# Patient Record
Sex: Male | Born: 1975
Health system: Southern US, Community
[De-identification: ages and names within clinical notes are randomized; demographics above are authoritative.]

## PROBLEM LIST (undated history)

## (undated) DIAGNOSIS — F32A Depression, unspecified: Secondary | ICD-10-CM

## (undated) DIAGNOSIS — F1910 Other psychoactive substance abuse, uncomplicated: Secondary | ICD-10-CM

## (undated) DIAGNOSIS — F329 Major depressive disorder, single episode, unspecified: Secondary | ICD-10-CM

## (undated) DIAGNOSIS — F419 Anxiety disorder, unspecified: Secondary | ICD-10-CM

## (undated) DIAGNOSIS — M549 Dorsalgia, unspecified: Secondary | ICD-10-CM

## (undated) DIAGNOSIS — G8929 Other chronic pain: Secondary | ICD-10-CM

## (undated) DIAGNOSIS — Z720 Tobacco use: Secondary | ICD-10-CM

## (undated) DIAGNOSIS — F259 Schizoaffective disorder, unspecified: Secondary | ICD-10-CM

---

## 1999-04-11 ENCOUNTER — Ambulatory Visit (HOSPITAL_COMMUNITY): Admission: RE | Admit: 1999-04-11 | Discharge: 1999-04-11 | Payer: Self-pay | Admitting: Neurosurgery

## 1999-04-11 ENCOUNTER — Encounter: Payer: Self-pay | Admitting: Neurosurgery

## 1999-04-19 ENCOUNTER — Ambulatory Visit (HOSPITAL_COMMUNITY): Admission: RE | Admit: 1999-04-19 | Discharge: 1999-04-19 | Payer: Self-pay | Admitting: Neurosurgery

## 1999-04-19 ENCOUNTER — Encounter: Payer: Self-pay | Admitting: Neurosurgery

## 1999-05-03 ENCOUNTER — Encounter: Payer: Self-pay | Admitting: Neurosurgery

## 1999-05-03 ENCOUNTER — Ambulatory Visit (HOSPITAL_COMMUNITY): Admission: RE | Admit: 1999-05-03 | Discharge: 1999-05-03 | Payer: Self-pay | Admitting: Neurosurgery

## 1999-05-17 ENCOUNTER — Ambulatory Visit (HOSPITAL_COMMUNITY): Admission: RE | Admit: 1999-05-17 | Discharge: 1999-05-17 | Payer: Self-pay | Admitting: Neurosurgery

## 1999-05-17 ENCOUNTER — Encounter: Payer: Self-pay | Admitting: Neurosurgery

## 2005-05-27 ENCOUNTER — Emergency Department (HOSPITAL_COMMUNITY): Admission: EM | Admit: 2005-05-27 | Discharge: 2005-05-27 | Payer: Self-pay | Admitting: Emergency Medicine

## 2005-05-30 ENCOUNTER — Encounter: Admission: RE | Admit: 2005-05-30 | Discharge: 2005-05-30 | Payer: Self-pay | Admitting: Neurosurgery

## 2005-06-13 ENCOUNTER — Encounter: Admission: RE | Admit: 2005-06-13 | Discharge: 2005-06-13 | Payer: Self-pay | Admitting: Neurosurgery

## 2008-07-26 ENCOUNTER — Emergency Department (HOSPITAL_COMMUNITY): Admission: EM | Admit: 2008-07-26 | Discharge: 2008-07-26 | Payer: Self-pay | Admitting: Emergency Medicine

## 2008-07-27 ENCOUNTER — Inpatient Hospital Stay (HOSPITAL_COMMUNITY): Admission: EM | Admit: 2008-07-27 | Discharge: 2008-08-04 | Payer: Self-pay | Admitting: Emergency Medicine

## 2008-08-21 ENCOUNTER — Emergency Department (HOSPITAL_COMMUNITY): Admission: EM | Admit: 2008-08-21 | Discharge: 2008-08-21 | Payer: Self-pay | Admitting: Emergency Medicine

## 2008-11-14 ENCOUNTER — Ambulatory Visit (HOSPITAL_COMMUNITY): Admission: RE | Admit: 2008-11-14 | Discharge: 2008-11-14 | Payer: Self-pay | Admitting: Chiropractic Medicine

## 2010-06-13 ENCOUNTER — Emergency Department (HOSPITAL_COMMUNITY): Admission: EM | Admit: 2010-06-13 | Discharge: 2010-06-13 | Payer: Self-pay | Admitting: Family Medicine

## 2010-11-04 ENCOUNTER — Emergency Department (HOSPITAL_COMMUNITY)
Admission: EM | Admit: 2010-11-04 | Discharge: 2010-11-04 | Disposition: A | Discharge status: Home / Self Care | Payer: Medicaid Other | Source: Ambulatory Visit | Attending: Emergency Medicine | Admitting: Emergency Medicine

## 2010-11-04 DIAGNOSIS — M542 Cervicalgia: Secondary | ICD-10-CM | POA: Insufficient documentation

## 2010-11-04 DIAGNOSIS — F3289 Other specified depressive episodes: Secondary | ICD-10-CM | POA: Insufficient documentation

## 2010-11-04 DIAGNOSIS — M549 Dorsalgia, unspecified: Secondary | ICD-10-CM | POA: Insufficient documentation

## 2010-11-04 DIAGNOSIS — R070 Pain in throat: Secondary | ICD-10-CM | POA: Insufficient documentation

## 2010-11-04 DIAGNOSIS — J36 Peritonsillar abscess: Secondary | ICD-10-CM | POA: Insufficient documentation

## 2010-11-04 DIAGNOSIS — R51 Headache: Secondary | ICD-10-CM | POA: Insufficient documentation

## 2010-11-04 DIAGNOSIS — F329 Major depressive disorder, single episode, unspecified: Secondary | ICD-10-CM | POA: Insufficient documentation

## 2010-11-04 DIAGNOSIS — Z79899 Other long term (current) drug therapy: Secondary | ICD-10-CM | POA: Insufficient documentation

## 2010-11-04 DIAGNOSIS — IMO0002 Reserved for concepts with insufficient information to code with codable children: Secondary | ICD-10-CM | POA: Insufficient documentation

## 2010-11-04 DIAGNOSIS — G8929 Other chronic pain: Secondary | ICD-10-CM | POA: Insufficient documentation

## 2010-11-04 DIAGNOSIS — F988 Other specified behavioral and emotional disorders with onset usually occurring in childhood and adolescence: Secondary | ICD-10-CM | POA: Insufficient documentation

## 2010-11-04 DIAGNOSIS — R599 Enlarged lymph nodes, unspecified: Secondary | ICD-10-CM | POA: Insufficient documentation

## 2011-01-29 NOTE — Consult Note (Signed)
Richard Benitez, HELFMAN NO.:  [id number]   MEDICAL RECORD NO.:  [id number]          PATIENT TYPE:  INP   LOCATION:  1236                         FACILITY:  [facility]   PHYSICIAN:  Antonietta Breach, M.D.  DATE OF BIRTH:  1976-04-29   DATE OF CONSULTATION:  07/27/2008  DATE OF DISCHARGE:                                 CONSULTATION   REASON FOR CONSULTATION:  Psychosis.   REQUESTING PHYSICIAN:  Larina Earthly, M.D.   HISTORY OF PRESENT ILLNESS:  Mr. Pryor Guettler is a 35 year old male  admitted to the [facility] on July 27, 2008, with acute  mental status changes.   Mr. Chandler has had a thorough organic workup.  He has been seen twice in  the emergency room for hallucinations, delusions in the context of a new  onset.  His urine drug screen is positive for marijuana.  The patient is  a poor historian.  However, when interviewing his sister she states that  she does not know any history of any form of psychomimetic substances.   The patient continues with difficulty with orientation.  He has ideas of  reference and hallucinations.   PAST PSYCHIATRIC HISTORY:  None known.  The patient does not have a  history of this kind of episode before.   FAMILY PSYCHIATRIC HISTORY:  There is a reported history of depression.   SOCIAL HISTORY:  The patient has been living with his parents.  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  He did start  resmoking marijuana recently.  He drinks two beers a week.  He did have  some experimentation with some other illegal drugs in his teens.   PAST MEDICAL HISTORY:  Urinary tract infection, chronic low back pain.   ALLERGIES:  No known drug allergies.   MEDICATIONS:  1. MAR is reviewed.  Mr. Hauss is on Ativan and has received 2 mg      three times over the past 24 hours.  2. Thiamine 100 mg daily.  3. Haldol 2.5 mg q.4 h p.r.n.  4. Ativan 1 mg q. 1 hour p.r.n.   DATA:  His organic workup is  negative including a basic metabolic panel  and his SGOT is 77, SGPT 66.  Urine drug screen benzodiazepine and THC  positive.  Ammonia, ethanol and TSH all unremarkable.  WBC 7.5,  hemoglobin 14.8, platelet count 94.  Head CT without contrast  unremarkable.   REVIEW OF SYSTEMS:  Unremarkable.   PHYSICAL EXAMINATION:  VITAL SIGNS:  Vital signs stable.  MENTAL STATUS EXAM:  Mr. Gamel is agitated, alternating between sitting  on his hospital bed and pacing.  His attention span is poor.  His  thought process is disorganized.  Eye contact is poor.  Concentration is  poor.  Orientation - the patient is not oriented to place, the month or  the year.  He appears to be oriented to himself.  Memory 3/3 visual  objects immediately, 2/3 visual objects on recall.  He does have ideas  of reference on thought content and  additional thought process data.  There are looseness of associations.  He has racing thoughts.  His  judgment is impaired.  His insight is poor.  He is not combative.  He is  redirectable.   ASSESSMENT:  AXIS I:  Rule out 293.82 psychotic disorder not otherwise  specified versus delirium not otherwise specified.  AXIS II:  Deferred.  AXIS III:  See past medical history.  AXIS IV:  General medical primary support group.  AXIS V:  20.   RECOMMENDATIONS:  1. Would check a B12, folic acid and an RPR.  2. Would start Zyprexa 5 mg p.o. or IM q. 1600 for anti-psychosis and      anti-agitation.  3. Would increase the Ativan p.r.n. to 1-3 mg q.4 h p.o. IM or IV      p.r.n. severe anxiety and agitation.  4. Would admit to an inpatient psychiatric ward as soon as possible.  5. If the patient's memory and cognitive status do not improve would      execute a further organic workup including an MRI with and without      contrast and consult neurology.      Antonietta Breach, M.D.  Electronically Signed     JW/MEDQ  D:  07/29/2008  T:  07/29/2008  Job:  [id number]

## 2011-01-29 NOTE — Consult Note (Signed)
NAMEBENJIMIN, HADDEN NO.:  [id number]   MEDICAL RECORD NO.:  [id number]          PATIENT TYPE:  INP   LOCATION:  1421                         FACILITY:  [facility]   PHYSICIAN:  Antonietta Breach, M.D.  DATE OF BIRTH:  July 07, 1976   DATE OF CONSULTATION:  08/02/2008  DATE OF DISCHARGE:                                 CONSULTATION   FOLLOWUP:  Mr. Poehler has recovered from his severe confusion.  He  received his last Ativan yesterday.  He is oriented to all spheres.  His  memory function is now intact.  His mood and interests and hope are all  intact.  He is not having anymore hallucinations or delusions.   MENTAL STATUS EXAM:  Eye contact is good.  Affect is slightly anxious at  baseline but with a broad and appropriate range.  Mood is within normal  limits.  He is oriented to all spheres.  Memory function is now intact  for immediate, recent and remote.  Speech is within normal limits.  Thought process logical, coherent, goal-directed.  No looseness of  associations.  Thought content:  No thoughts of harming himself.  No  thoughts of harming others.  No delusions or hallucinations.  Insight is  intact.  Judgment is intact.   ASSESSMENT:  1. 293.82 psychotic disorder, not otherwise specified.  He is now      cleared for discharge.  2. Polysubstance dependence.  3. Mr. Hattabaugh agrees to call emergency services immediately for any      thoughts of harming himself, thoughts of harming others or      distress.   RECOMMENDATIONS:  Emphatically with a chemical dependency, inpatient  rehabilitation program.  However, the patient still wants to think about  it. The 12-step method and groups were also recommended.  He wants to  meet with the social worker in assisting him and gaining additional  information about his rehabilitation options.      Antonietta Breach, M.D.  Electronically Signed     JW/MEDQ  D:  08/04/2008  T:  08/04/2008  Job:  [id number]

## 2011-01-29 NOTE — H&P (Signed)
NAMEMANCE, VALLEJO NO.:  [id number]   MEDICAL RECORD NO.:  [id number]          PATIENT TYPE:  INP   LOCATION:  1510                         [facility]ACILITY:  [facility]   PHYSICIAN:  Eduard Clos, MDDATE OF BIRTH:  1976-05-11   DATE OF ADMISSION:  07/27/2008  DATE OF DISCHARGE:                              HISTORY & PHYSICAL   History obtained from patient, patient's family and ER physician.   PRIMARY CARE PHYSICIAN:  Dr. Larina Earthly.   CHIEF COMPLAINT:  Altered mental status.   HISTORY OF PRESENT ILLNESS:  A 35 year old male with history of  depression, off medication for almost 6 months now, and chronic low back  pain who was brought into the ER by the patient's family as the patient  was getting increasingly confused with hallucinations, both visual and  auditory.  The patient had these symptoms over the last 3 days and  states that he has not actually eaten well at all.  He had symptoms for  the last 3 days.  He called his mother over the phone yesterday.  While  in the ER, he was given some IV Ativan and sent home with p.o. Ativan  for follow up with behavioral health, despite which the patient was  getting more and more confused and brought in again to the ER.  The  patient has been presently admitted because of his continued agitation  and sinus tachycardia.  The patient states that he has not been doing  well the last 3 days, and he states he smokes cigarettes.  He denies any  drug abuse, though his urine drug was positive for marijuana.  He drinks  alcohol only twice a week, usually beer.  He denies any chest pain,  shortness of breath, loss of consciousness, weakness of limbs or  abdominal pain, though he states he does have a knot on his flank area  which is bothering him at times with acute pain.  He denies any  diarrhea, dysuria, discharges, fever, chills or headache.   PAST MEDICAL HISTORY:  History of depression and chronic back pain, off  medications now.  He was initially treated for his chronic back pain  with Flexeril and hydrocodone, which was stopped in July by his primary  care physician.   PAST SURGICAL HISTORY:  None.   MEDICATIONS PRIOR TO ADMISSION:  None, except Ativan which was given  yesterday   ALLERGIES:  NO KNOWN DRUG ALLERGIES.   SOCIAL HISTORY:  The patient lives with his parents.  Smokes cigarettes  and advised to quit smoking.  Drinks alcohol twice a week, usually  Budwe[employer] beer.  He denies any drug abuse.  He states he only did drugs  when he was in his teens.  Drug screen is positive for marijuana and  also benzo which he just took yesterday.   REVIEW OF SYSTEMS:  As per history of present illness, nothing else  significant.   PHYSICAL EXAMINATION:  GENERAL:  Patient examined at bedside.  He is  currently restless.  He is able to answer questions.  He has some  hallucinations.  VITAL SIGNS:  Blood pressure is 140/80, pulse 120 per minute, regular  sinus, temperature 99.1, respiration 18 per minute.  O2 sat 98%.  HEENT:  Anicteric.  No pallor.  CHEST:  Bilateral air entry present.  No rhonchi, no crepitation.  HEART:  S1-S2 heard.  ABDOMEN:  Soft, nontender.  Bowel sounds heard.  No guarding, no  rigidity.  CNS:  Alert, awake, oriented to time, place and person.  He is  complaining of hallucinations, very restless.  Moves upper and lower  extremities 5/5.  EXTREMITIES:  Peripheral pulses felt.  No edema.   LABORATORY DATA:  CT of the head shows nothing acute.  CBC - WBCs 10,  hemoglobin 16.3, hematocrit 48.3, MCV 100, platelets 121, neutrophils  86%.  Comprehensive metabolic panel:  Sodium 140, potassium 3, chloride  97, carbon dioxide 29, glucose 181, BUN 26, creatinine 1.2, total  bilirubin 2.3, AST 97, ALT 84, total protein 8.4, albumin 5.2, calcium  10.6.  Drug screen is positive for benzo and marijuana, alcohol level  less than 5.  UA is positive for ketones, moderate bilirubin,  nitrates  positive, leukocytes small, WBC 3-6, bacteria few   ASSESSMENT:  1. Altered mental status with visual and auditory hallucinations and      restlessness, probably withdrawing from drugs versus acute      psychosis.  2. Abnormal liver function tests.  3. Polysubstance abuse.  4. Sinus tachycardia, probably from agitation.  5. Chronic history of low back pain.  6. Urinary tract infection.   PLAN:  Admit the patient to step-down unit for closer observation.  Will  place the patient on Ativan 1 liter every hour for agitation as needed.  Also place on Haldol 2.5 mEq every 4 hours p.r.n. for agitation.  Will  send blood cultures, urine cultures, place patient on IV antibiotics.  EKG.  Will check a D-dimer and Tylenol level.  Repeat ammonia and LFTs  in a.m.  Get a CT of the abdomen and pelvis as there was a worrisome  nodule in his left flank area, which he states disturbs him.  Further  recommendation as condition evolves.  The patient will need a  psychiatric assessment.      Eduard Clos, MD  Electronically Signed     ANK/MEDQ  D:  07/27/2008  T:  07/27/2008  Job:  [id number]

## 2011-01-29 NOTE — Discharge Summary (Signed)
Richard Benitez, Richard Benitez                 ACCOUNT NO.:  [id number]   MEDICAL RECORD NO.:  [id number]          PATIENT TYPE:  INP   LOCATION:  1421                         FACILITY:  [facility]   PHYSICIAN:  Larina Earthly, M.D.        DATE OF BIRTH:  10/26/1975   DATE OF ADMISSION:  07/27/2008  DATE OF DISCHARGE:  08/04/2008                               DISCHARGE SUMMARY   DISCHARGE DIAGNOSES:  1. Ethanol withdrawal with some related delirium tremens with severe      agitation.  2. Severe fatty liver, presumably secondary to alcohol intake.  3. Chronic low back pain, presumably secondary to herniated disk of      longstanding duration.  4. Mood disorder.  5. Questionable rhabdomyolysis.   DISCHARGE MEDICATIONS:  1. Hydrocodone 10 mg every 4 hours as needed.  Prescription for 50      with no refills.  2. Duragesic patch 75 mcg/hr, change every 3 days.  Prescription for      10 with no refills.  3. Thiamine 100 mg every day.  4. Folic acid 1 mg every day.  5. Ativan 1 mg every 8 hours.  Prescription for 90 with no refills.  6. Nicotine patch 21 mg per 24 hours, prescription for 28, no refills.   Patient was instructed to stop all illegal substances and all alcohol of  any kind.  Patient is to follow up with my office in 3 weeks for further  evaluation and management as well as liver function tests.  Patient also  has an appointment with [facility] at [address] [place], [place] [address] at 12 noon on the day of  discharge, at which time they will decide whether the patient is  appropriate for inpatient versus outpatient management, hopefully  inpatient management.  To me, it is unclear as to whether they will  continue the patient's oral medications or whether I will be  responsible.  Certainly, the clinical social worker is to make a  disposition on this on the day of discharge.  Again, patient was  instructed to discontinue all illegal substances as well as  any alcohol  intake on the day of discharge and was given the reasons for so on  multiple occasions.   PERTINENT LABS/RADIOLOGICAL FINDINGS:  On the day of discharge, sodium  143, potassium 4.2, BUN 12, creatinine 0.79, glucose 107, serum CO2 33.  Please note that the patient did suffer from some hypokalemia requiring  supplementation with a low potassium of 2.7 during the hospitalization.  CBC was unremarkable.  AST was 59, down from a high of 153.  ALT was  111, relatively stable throughout this hospitalization.  Alkaline  phosphatase was 86.  Total bilirubin 0.5, down from a high of 1.3.  Albumin 3.6.  CK's were initially elevated at 1587, decreased to 134  prior to discharge.  Calcium 9.7, phosphorus 4.2, magnesium 1.7, folate  472.  Blood cultures x1 negative.  Ammonia normal at 10.  Hepatitis  panel negative.   Abdominal and pelvis CT with contrast reveals severe  fatty infiltration  of the liver with no evidence of intraperitoneal air or ascites and  negative for lymphadenopathy.  Adrenal gland, kidneys, gallbladder,  pancreas, and spleen were within normal limits.   Pelvic CT was suggestive of bilateral sacroiliitis but no other findings  in the pelvis.   HISTORY OF PRESENT ILLNESS:  Patient was admitted onto the hospitalists'  service on July 27, 2008.  Please see the history and physical for  extensive details; however, the patient was admitted with altered mental  status with visual and auditory hallucinations, restlessness, thought to  be withdrawing from drugs versus acute psychosis in the setting of  abnormal liver function tests, polysubstance abuse, sinus tachycardia,  chronic low back pain, and urinary tract infection.  Patient was  admitted to step-down unit, placed on Ativan and Haldol and subsequently  transferred to my service within the next 24 hours.   HOSPITAL COURSE:  Patient's agitation progressively got worse, requiring  an IV Ativan drip as high as 14  mg/hr.  Critical care medicine did  consult on the patient briefly and did recommend the Ativan be changed  over to Valium, given potential toxic agents at this high dose from the  vehicle of the benzodiazepine drip.  At no time did the patient suffer  from respiratory depression.  He was also continued on Ativan.  Psychiatric consultation was obtained with Dr. Antonietta Breach, who also  recommended inpatient admission for psychiatric issues, including his  acute psychosis once medically stable.  Of note, B12, folate, RPR were  all unremarkable.  The acute hepatitis panel was unremarkable.  Laboratory evaluations, despite the patient's severe fatty liver,  altered mental status, patient did not develop any evidence of acute  liver failure, inability to synthesize needed proteins, or coagulopathy.  That was critically relevant.  Patient was given significant IV fluids  with D5, thiamine, and folate.  He was restrained for safety.  His CK  levels were monitored, given initial elevation, and he never suffered  from renal insufficiency.  Lack of nutrition was discussed with the  family; however, given the fact that the patient improved clinically  over the next 48 hours, tube feedings were never started.  Potassium was  repleted.  Extensive discussions were undertaken with the patient as  well as family with respect to his presumed alcohol withdrawal/delirium  tremens.  Family reports that 15 empty bottles of liquor were found in  the patient's room.  This is complicated by the patient's history of  longstanding benzodiazepine dependence as well as narcotic dependence in  the setting of his chronic low back pain.  After the patient's mental  status normalized, at which point, the patient was taking 1 mg of Ativan  every 8 hours along with morphine and oral narcotics for his chronic low  back pain.  It was decided that he would be an appropriate candidate for  substance abuse treatment.  The  patient was agreeable.  Clinical Social  Work set up an appointment in conjunction with Dr. Jeanie Sewer.  An  appointment with [facility] in [place]  for August 04, 2008, at which time he will hopefully  be assessed for  further inpatient rehab efforts.   Of note, given the sacroiliitis found on initial CT scan ordered by the  hospitalists, we will further evaluate the patient for any type of  connective tissue disease and/or rheumatological disorders on an  outpatient basis when the patient follows up in our office in  3 weeks.      Larina Earthly, M.D.  Electronically Signed     RA/MEDQ  D:  08/04/2008  T:  08/04/2008  Job:  [id number]

## 2011-06-18 LAB — COMPREHENSIVE METABOLIC PANEL
ALT: 108 — ABNORMAL HIGH
ALT: 111 — ABNORMAL HIGH
ALT: 76 — ABNORMAL HIGH
ALT: 82 — ABNORMAL HIGH
ALT: 91 — ABNORMAL HIGH
ALT: 92 — ABNORMAL HIGH
AST: 59 — ABNORMAL HIGH
AST: 68 — ABNORMAL HIGH
AST: 78 — ABNORMAL HIGH
AST: 95 — ABNORMAL HIGH
Albumin: 3.2 — ABNORMAL LOW
Albumin: 3.3 — ABNORMAL LOW
Albumin: 3.3 — ABNORMAL LOW
Alkaline Phosphatase: 73
Alkaline Phosphatase: 76
Alkaline Phosphatase: 79
BUN: 12
BUN: 14
BUN: 24 — ABNORMAL HIGH
BUN: 4 — ABNORMAL LOW
BUN: 7
BUN: 7
CO2: 25
CO2: 26
CO2: 33 — ABNORMAL HIGH
CO2: 34 — ABNORMAL HIGH
Calcium: 10.4
Calcium: 8.6
Calcium: 9.2
Calcium: 9.5
Calcium: 9.5
Calcium: 9.7
Chloride: 103
Chloride: 107
Chloride: 110
Chloride: 97
Creatinine, Ser: 0.74
Creatinine, Ser: 0.76
Creatinine, Ser: 0.84
Creatinine, Ser: 0.87
GFR calc Af Amer: >60
GFR calc Af Amer: >60
GFR calc Af Amer: >60
GFR calc non Af Amer: >60
GFR calc non Af Amer: >60
GFR calc non Af Amer: >60
GFR calc non Af Amer: >60
GFR calc non Af Amer: >60
GFR calc non Af Amer: >60
GFR calc non Af Amer: >60
Glucose, Bld: 100 — ABNORMAL HIGH
Glucose, Bld: 121 — ABNORMAL HIGH
Glucose, Bld: 75
Glucose, Bld: 78
Glucose, Bld: 91
Glucose, Bld: 97
Potassium: 2.7 — CL
Potassium: 3 — ABNORMAL LOW
Potassium: 3.4 — ABNORMAL LOW
Potassium: 4.2
Sodium: 140
Sodium: 140
Sodium: 141
Sodium: 141
Sodium: 143
Sodium: 144
Total Bilirubin: 0.4
Total Bilirubin: 0.8
Total Bilirubin: 1.2
Total Bilirubin: 1.3 — ABNORMAL HIGH
Total Bilirubin: 1.5 — ABNORMAL HIGH
Total Bilirubin: 2.3 — ABNORMAL HIGH
Total Protein: 5.6 — ABNORMAL LOW
Total Protein: 5.9 — ABNORMAL LOW
Total Protein: 6
Total Protein: 6.3
Total Protein: 6.4
Total Protein: 7.4

## 2011-06-18 LAB — CBC
HCT: 33.1 — ABNORMAL LOW
HCT: 34 — ABNORMAL LOW
HCT: 36.1 — ABNORMAL LOW
HCT: 37 — ABNORMAL LOW
HCT: 39.6
HCT: 43.2
HCT: 48.3
Hemoglobin: 11.6 — ABNORMAL LOW
Hemoglobin: 13.6
Hemoglobin: 14.8
Hemoglobin: 16.3
MCHC: 33.7
MCHC: 34.1
MCHC: 34.3
MCHC: 34.3
MCV: 100.2 — ABNORMAL HIGH
MCV: 100.7 — ABNORMAL HIGH
MCV: 100.9 — ABNORMAL HIGH
MCV: 99.1
MCV: 99.7
Platelets: 108 — ABNORMAL LOW
Platelets: 121 — ABNORMAL LOW
Platelets: 238
Platelets: 75 — ABNORMAL LOW
Platelets: 86 — ABNORMAL LOW
RBC: 3.58 — ABNORMAL LOW
RBC: 3.93 — ABNORMAL LOW
RBC: 4.83
RDW: 12.7
RDW: 12.9
WBC: 10
WBC: 4.4
WBC: 4.5
WBC: 5.5
WBC: 7.3
WBC: 7.5

## 2011-06-18 LAB — AMMONIA
Ammonia: 10 — ABNORMAL LOW
Ammonia: 21

## 2011-06-18 LAB — DIFFERENTIAL
Basophils Absolute: 0
Basophils Relative: 0
Eosinophils Absolute: 0
Lymphocytes Relative: 22
Lymphs Abs: 1.7
Monocytes Absolute: 0.8
Monocytes Relative: 11
Monocytes Relative: 6
Neutrophils Relative %: 67
Neutrophils Relative %: 86 — ABNORMAL HIGH

## 2011-06-18 LAB — MAGNESIUM: Magnesium: 1.9

## 2011-06-18 LAB — BASIC METABOLIC PANEL
BUN: 5 — ABNORMAL LOW
CO2: 26
Chloride: 111
Creatinine, Ser: 0.7
Glucose, Bld: 92
Potassium: 3.5

## 2011-06-18 LAB — HEPATITIS PANEL, ACUTE: HCV Ab: NEGATIVE

## 2011-06-18 LAB — URINE MICROSCOPIC-ADD ON

## 2011-06-18 LAB — URINALYSIS, ROUTINE W REFLEX MICROSCOPIC
Glucose, UA: NEGATIVE
Hgb urine dipstick: NEGATIVE
Nitrite: POSITIVE — AB
pH: 6.5

## 2011-06-18 LAB — APTT: aPTT: 27

## 2011-06-18 LAB — HEPATIC FUNCTION PANEL
Alkaline Phosphatase: 87
Bilirubin, Direct: 0.3
Indirect Bilirubin: 1 — ABNORMAL HIGH
Total Bilirubin: 1.3 — ABNORMAL HIGH

## 2011-06-18 LAB — CK
Total CK: 134
Total CK: 242 — ABNORMAL HIGH

## 2011-06-18 LAB — FOLATE RBC: RBC Folate: 472

## 2011-06-18 LAB — RAPID URINE DRUG SCREEN, HOSP PERFORMED
Amphetamines: NOT DETECTED
Benzodiazepines: POSITIVE — AB
Cocaine: NOT DETECTED
Opiates: NOT DETECTED
Tetrahydrocannabinol: POSITIVE — AB

## 2011-06-18 LAB — PHOSPHORUS: Phosphorus: 4.2

## 2011-06-18 LAB — PROTIME-INR: INR: 0.9

## 2011-06-18 LAB — CULTURE, BLOOD (ROUTINE X 2)

## 2011-06-18 LAB — ETHANOL: Alcohol, Ethyl (B): <5

## 2011-06-18 LAB — CK TOTAL AND CKMB (NOT AT ARMC): Total CK: 169

## 2011-06-21 LAB — DIFFERENTIAL
Basophils Absolute: 0 10*3/uL (ref 0.0–0.1)
Eosinophils Absolute: 0 10*3/uL (ref 0.0–0.7)
Lymphs Abs: 1.3 10*3/uL (ref 0.7–4.0)
Neutrophils Relative %: 66 % (ref 43–77)

## 2011-06-21 LAB — POCT I-STAT, CHEM 8
Creatinine, Ser: 1.3 mg/dL (ref 0.4–1.5)
HCT: 44 % (ref 39.0–52.0)
Hemoglobin: 15 g/dL (ref 13.0–17.0)
Potassium: 3.6 mEq/L (ref 3.5–5.1)
Sodium: 144 mEq/L (ref 135–145)

## 2011-06-21 LAB — RAPID URINE DRUG SCREEN, HOSP PERFORMED
Amphetamines: NOT DETECTED
Barbiturates: NOT DETECTED
Benzodiazepines: POSITIVE — AB

## 2011-06-21 LAB — CBC
MCV: 98.3 fL (ref 78.0–100.0)
Platelets: 273 10*3/uL (ref 150–400)
WBC: 5.5 10*3/uL (ref 4.0–10.5)

## 2011-06-21 LAB — ETHANOL: Alcohol, Ethyl (B): <5 mg/dL (ref 0–10)

## 2011-09-24 DIAGNOSIS — K219 Gastro-esophageal reflux disease without esophagitis: Secondary | ICD-10-CM | POA: Diagnosis not present

## 2011-09-24 DIAGNOSIS — F329 Major depressive disorder, single episode, unspecified: Secondary | ICD-10-CM | POA: Diagnosis not present

## 2011-09-24 DIAGNOSIS — J309 Allergic rhinitis, unspecified: Secondary | ICD-10-CM | POA: Diagnosis not present

## 2011-09-24 DIAGNOSIS — M549 Dorsalgia, unspecified: Secondary | ICD-10-CM | POA: Diagnosis not present

## 2011-11-01 DIAGNOSIS — F329 Major depressive disorder, single episode, unspecified: Secondary | ICD-10-CM | POA: Diagnosis not present

## 2012-01-30 DIAGNOSIS — F329 Major depressive disorder, single episode, unspecified: Secondary | ICD-10-CM | POA: Diagnosis not present

## 2012-01-30 DIAGNOSIS — M549 Dorsalgia, unspecified: Secondary | ICD-10-CM | POA: Diagnosis not present

## 2012-04-24 DIAGNOSIS — M549 Dorsalgia, unspecified: Secondary | ICD-10-CM | POA: Diagnosis not present

## 2012-04-24 DIAGNOSIS — F329 Major depressive disorder, single episode, unspecified: Secondary | ICD-10-CM | POA: Diagnosis not present

## 2012-04-24 DIAGNOSIS — J309 Allergic rhinitis, unspecified: Secondary | ICD-10-CM | POA: Diagnosis not present

## 2012-06-04 DIAGNOSIS — F3189 Other bipolar disorder: Secondary | ICD-10-CM | POA: Diagnosis not present

## 2012-06-18 DIAGNOSIS — F3189 Other bipolar disorder: Secondary | ICD-10-CM | POA: Diagnosis not present

## 2012-06-30 DIAGNOSIS — F3189 Other bipolar disorder: Secondary | ICD-10-CM | POA: Diagnosis not present

## 2012-07-07 DIAGNOSIS — F3189 Other bipolar disorder: Secondary | ICD-10-CM | POA: Diagnosis not present

## 2012-07-16 DIAGNOSIS — F3189 Other bipolar disorder: Secondary | ICD-10-CM | POA: Diagnosis not present

## 2012-08-10 DIAGNOSIS — F3189 Other bipolar disorder: Secondary | ICD-10-CM | POA: Diagnosis not present

## 2012-08-25 DIAGNOSIS — M549 Dorsalgia, unspecified: Secondary | ICD-10-CM | POA: Diagnosis not present

## 2012-08-25 DIAGNOSIS — F329 Major depressive disorder, single episode, unspecified: Secondary | ICD-10-CM | POA: Diagnosis not present

## 2012-11-09 DIAGNOSIS — F3189 Other bipolar disorder: Secondary | ICD-10-CM | POA: Diagnosis not present

## 2012-11-23 DIAGNOSIS — Z79899 Other long term (current) drug therapy: Secondary | ICD-10-CM | POA: Diagnosis not present

## 2012-11-23 DIAGNOSIS — M549 Dorsalgia, unspecified: Secondary | ICD-10-CM | POA: Diagnosis not present

## 2013-01-07 DIAGNOSIS — F3189 Other bipolar disorder: Secondary | ICD-10-CM | POA: Diagnosis not present

## 2013-03-23 DIAGNOSIS — F329 Major depressive disorder, single episode, unspecified: Secondary | ICD-10-CM | POA: Diagnosis not present

## 2013-03-23 DIAGNOSIS — Z79899 Other long term (current) drug therapy: Secondary | ICD-10-CM | POA: Diagnosis not present

## 2013-03-23 DIAGNOSIS — M545 Low back pain: Secondary | ICD-10-CM | POA: Diagnosis not present

## 2013-03-23 DIAGNOSIS — K219 Gastro-esophageal reflux disease without esophagitis: Secondary | ICD-10-CM | POA: Diagnosis not present

## 2013-04-07 DIAGNOSIS — F3189 Other bipolar disorder: Secondary | ICD-10-CM | POA: Diagnosis not present

## 2013-06-07 DIAGNOSIS — F3189 Other bipolar disorder: Secondary | ICD-10-CM | POA: Diagnosis not present

## 2013-07-07 DIAGNOSIS — F3189 Other bipolar disorder: Secondary | ICD-10-CM | POA: Diagnosis not present

## 2013-08-03 DIAGNOSIS — Z6827 Body mass index (BMI) 27.0-27.9, adult: Secondary | ICD-10-CM | POA: Diagnosis not present

## 2013-08-03 DIAGNOSIS — Z1331 Encounter for screening for depression: Secondary | ICD-10-CM | POA: Diagnosis not present

## 2013-08-03 DIAGNOSIS — M549 Dorsalgia, unspecified: Secondary | ICD-10-CM | POA: Diagnosis not present

## 2013-08-03 DIAGNOSIS — K219 Gastro-esophageal reflux disease without esophagitis: Secondary | ICD-10-CM | POA: Diagnosis not present

## 2013-08-03 DIAGNOSIS — F329 Major depressive disorder, single episode, unspecified: Secondary | ICD-10-CM | POA: Diagnosis not present

## 2013-08-03 DIAGNOSIS — J309 Allergic rhinitis, unspecified: Secondary | ICD-10-CM | POA: Diagnosis not present

## 2013-09-11 ENCOUNTER — Encounter (HOSPITAL_COMMUNITY): Payer: Self-pay | Admitting: Emergency Medicine

## 2013-09-11 ENCOUNTER — Emergency Department (HOSPITAL_COMMUNITY)
Admission: EM | Admit: 2013-09-11 | Discharge: 2013-09-12 | Disposition: A | Discharge status: Home / Self Care | Payer: Medicare Other | Attending: Emergency Medicine | Admitting: Emergency Medicine

## 2013-09-11 ENCOUNTER — Emergency Department (HOSPITAL_COMMUNITY): Payer: Medicare Other

## 2013-09-11 DIAGNOSIS — F17200 Nicotine dependence, unspecified, uncomplicated: Secondary | ICD-10-CM | POA: Insufficient documentation

## 2013-09-11 DIAGNOSIS — Z79899 Other long term (current) drug therapy: Secondary | ICD-10-CM | POA: Diagnosis not present

## 2013-09-11 DIAGNOSIS — R609 Edema, unspecified: Secondary | ICD-10-CM | POA: Insufficient documentation

## 2013-09-11 DIAGNOSIS — K029 Dental caries, unspecified: Secondary | ICD-10-CM | POA: Insufficient documentation

## 2013-09-11 DIAGNOSIS — K047 Periapical abscess without sinus: Secondary | ICD-10-CM

## 2013-09-11 DIAGNOSIS — G8929 Other chronic pain: Secondary | ICD-10-CM | POA: Diagnosis not present

## 2013-09-11 DIAGNOSIS — M549 Dorsalgia, unspecified: Secondary | ICD-10-CM | POA: Diagnosis not present

## 2013-09-11 DIAGNOSIS — H00039 Abscess of eyelid unspecified eye, unspecified eyelid: Secondary | ICD-10-CM | POA: Insufficient documentation

## 2013-09-11 DIAGNOSIS — K089 Disorder of teeth and supporting structures, unspecified: Secondary | ICD-10-CM | POA: Diagnosis present

## 2013-09-11 DIAGNOSIS — F411 Generalized anxiety disorder: Secondary | ICD-10-CM | POA: Insufficient documentation

## 2013-09-11 DIAGNOSIS — L03213 Periorbital cellulitis: Secondary | ICD-10-CM

## 2013-09-11 HISTORY — DX: Dorsalgia, unspecified: M54.9

## 2013-09-11 HISTORY — DX: Other chronic pain: G89.29

## 2013-09-11 HISTORY — DX: Anxiety disorder, unspecified: F41.9

## 2013-09-11 LAB — CBC WITH DIFFERENTIAL/PLATELET
Hemoglobin: 12.5 g/dL — ABNORMAL LOW (ref 13.0–17.0)
Lymphocytes Relative: 29 % (ref 12–46)
Lymphs Abs: 2 10*3/uL (ref 0.7–4.0)
MCH: 29.6 pg (ref 26.0–34.0)
Monocytes Relative: 7 % (ref 3–12)
Neutro Abs: 4.3 10*3/uL (ref 1.7–7.7)
Neutrophils Relative %: 62 % (ref 43–77)
RBC: 4.22 MIL/uL (ref 4.22–5.81)
WBC: 6.9 10*3/uL (ref 4.0–10.5)

## 2013-09-11 LAB — POCT I-STAT, CHEM 8
Chloride: 102 mEq/L (ref 96–112)
Creatinine, Ser: 1 mg/dL (ref 0.50–1.35)
Glucose, Bld: 93 mg/dL (ref 70–99)
Potassium: 4 mEq/L (ref 3.5–5.1)
Sodium: 144 mEq/L (ref 135–145)

## 2013-09-11 MED ORDER — SODIUM CHLORIDE 0.9 % IV BOLUS (SEPSIS)
1000.0000 mL | Freq: Once | INTRAVENOUS | Status: AC
  Administered 2013-09-11: 1000 mL via INTRAVENOUS

## 2013-09-11 MED ORDER — CLINDAMYCIN PHOSPHATE 600 MG/50ML IV SOLN: 600.0000 mg | Freq: Once | INTRAVENOUS | Status: DC

## 2013-09-11 MED ORDER — IOHEXOL 300 MG/ML  SOLN
100.0000 mL | Freq: Once | INTRAMUSCULAR | Status: AC | PRN
  Administered 2013-09-11: 100 mL via INTRAVENOUS

## 2013-09-11 MED ORDER — CLINDAMYCIN PHOSPHATE 900 MG/50ML IV SOLN
900.0000 mg | Freq: Once | INTRAVENOUS | Status: AC
  Administered 2013-09-11: 900 mg via INTRAVENOUS
  Filled 2013-09-11 (×2): qty 50

## 2013-09-11 NOTE — ED Provider Notes (Signed)
CSN: [id number]     Arrival date & time 09/11/13  1827 History  This chart was scribed for non-physician practitioner, Ivonne Andrew, PA-C,working with Junius Argyle, MD, by Karle Plumber, ED Scribe.  This patient was seen in room WTR5/WTR5 and the patient's care was started at 9:35 PM.  Chief Complaint  Patient presents with  . Oral Swelling   The history is provided by the patient. No language interpreter was used.   HPI Comments:  Richard Benitez is a 36 y.o. male who presents to the Emergency Department complaining of facial and left eye swelling. Pt states he contacted his PCP and was called in Augmentin, which he states he has started. Pt reports associated itching of the left eye and face. His PCP and dentist recommended he come to the ED yesterday. Pt states the symptoms have worsened since yesterday. He denies fever. No other aggravating or alleviating factors. No other associated symptoms.   Past Medical History  Diagnosis Date  . Back pain, chronic   . Anxiety    History reviewed. No pertinent past surgical history. No family history on file. History  Substance Use Topics  . Smoking status: Current Every Day Smoker  . Smokeless tobacco: Not on file  . Alcohol Use: No    Review of Systems  Constitutional: Negative for fever and chills.  HENT: Positive for dental problem.   All other systems reviewed and are negative.    Allergies  Review of patient's allergies indicates no known allergies.  Home Medications   Current Outpatient Rx  Name  Route  Sig  Dispense  Refill  . amoxicillin-clavulanate (AUGMENTIN) 875-125 MG per tablet   Oral   Take 1 tablet by mouth 2 (two) times daily.         . clonazePAM (KLONOPIN) 1 MG tablet   Oral   Take 1 mg by mouth 3 (three) times daily.         . cyclobenzaprine (FLEXERIL) 10 MG tablet   Oral   Take 10 mg by mouth 3 (three) times daily as needed for muscle spasms.         Marland Kitchen HYDROcodone-acetaminophen  (NORCO) 10-325 MG per tablet   Oral   Take 1 tablet by mouth every 6 (six) hours as needed for moderate pain or severe pain.         Marland Kitchen morphine (MS CONTIN) 60 MG 12 hr tablet   Oral   Take 60 mg by mouth every 12 (twelve) hours.         Marland Kitchen PRESCRIPTION MEDICATION   Oral   Take 20 mg by mouth daily.         . risperiDONE (RISPERDAL) 2 MG tablet   Oral   Take 1 mg by mouth 2 (two) times daily.         . traZODone (DESYREL) 100 MG tablet   Oral   Take 100 mg by mouth 2 (two) times daily.          Triage Vitals: BP 112/73  Pulse 107  Temp(Src) 98.5 F (36.9 C) (Oral)  Resp 20  SpO2 99% Physical Exam  Nursing note and vitals reviewed. Constitutional: He is oriented to person, place, and time. He appears well-developed and well-nourished.  HENT:  Head: Normocephalic and atraumatic.  Mouth/Throat: Dental caries present.  Left upper K9 and left premolar decayed. Left facial swelling. Left periorbital swelling/infraorbital swelling.   Eyes: EOM are normal.  Neck: Normal range of motion.  Cardiovascular:  Normal rate.   Pulmonary/Chest: Effort normal.  Musculoskeletal: Normal range of motion.  Neurological: He is alert and oriented to person, place, and time.  Skin: Skin is warm and dry.  Psychiatric: He has a normal mood and affect. His behavior is normal.    ED Course  Procedures  DIAGNOSTIC STUDIES: Oxygen Saturation is 99% on RA, normal by my interpretation.   COORDINATION OF CARE: 9:41 PM- Will obtain lab work and give IV antibiotics. Will obtain imaging of the head. Pt verbalizes understanding and agrees to plan.  Patient continues to be doing well. CT scan does not show significant abscess infection. There is a slight periapical infection around the left canine tooth. Patient also with very mild inferior periorbital edema and cellulitis. Patient has received IV dose of clindamycin. At this time we'll plan to continue clindamycin at home. Patient given strict  return precautions and advised for a recheck in the next 24-48 hours. He agrees with this plan.   Medications  sodium chloride 0.9 % bolus 1,000 mL (not administered)  clindamycin (CLEOCIN) IVPB 900 mg (not administered)   Results for orders placed during the hospital encounter of 09/11/13  CBC WITH DIFFERENTIAL      Result Value Range   WBC 6.9  4.0 - 10.5 K/uL   RBC 4.22  4.22 - 5.81 MIL/uL   Hemoglobin 12.5 (*) 13.0 - 17.0 g/dL   HCT 16.1 (*) 09.6 - 04.5 %   MCV 89.6  78.0 - 100.0 fL   MCH 29.6  26.0 - 34.0 pg   MCHC 33.1  30.0 - 36.0 g/dL   RDW 40.9  81.1 - 91.4 %   Platelets 260  150 - 400 K/uL   Neutrophils Relative % 62  43 - 77 %   Neutro Abs 4.3  1.7 - 7.7 K/uL   Lymphocytes Relative 29  12 - 46 %   Lymphs Abs 2.0  0.7 - 4.0 K/uL   Monocytes Relative 7  3 - 12 %   Monocytes Absolute 0.5  0.1 - 1.0 K/uL   Eosinophils Relative 2  0 - 5 %   Eosinophils Absolute 0.1  0.0 - 0.7 K/uL   Basophils Relative 1  0 - 1 %   Basophils Absolute 0.0  0.0 - 0.1 K/uL  POCT I-STAT, CHEM 8      Result Value Range   Sodium 144  135 - 145 mEq/L   Potassium 4.0  3.5 - 5.1 mEq/L   Chloride 102  96 - 112 mEq/L   BUN 15  6 - 23 mg/dL   Creatinine, Ser 7.82  0.50 - 1.35 mg/dL   Glucose, Bld 93  70 - 99 mg/dL   Calcium, Ion 9.56 (*) 1.12 - 1.23 mmol/L   TCO2 31  0 - 100 mmol/L   Hemoglobin 13.6  13.0 - 17.0 g/dL   HCT 21.3  08.6 - 57.8 %     Imaging Review Ct Maxillofacial W/cm  09/11/2013   CLINICAL DATA:  Facial and left eye swelling, on antibiotics.  EXAM: CT MAXILLOFACIAL WITH CONTRAST  TECHNIQUE: Multidetector CT imaging of the maxillofacial structures was performed with intravenous contrast. Multiplanar CT image reconstructions were also generated. A small metallic BB was placed on the right temple in order to reliably differentiate right from left.  CONTRAST:  OMNIPAQUE IOHEXOL 300 MG/ML  SOLN  COMPARISON:  CT of the head July 26, 2008.  FINDINGS: Minimal left  periorbital/facial soft tissue swelling without subcutaneous gas,  radiopaque foreign bodies, free fluid or abscess. Ocular globes are well formed and located. Preservation orbital fat. Optic nerve sheath complexes are unremarkable. Extraocular muscles are well formed and located, symmetric in appearance. Superior ophthalmic veins are not enlarged.  Trace paranasal sinus mucosal thickening, no air-fluid levels. Visualized mastoid air cells are well aerated. No destructive bony lesions. Left mandible incisor periapical lucency (approximate tooth number 24). Additional caries noted. Included airway is widely patent.  IMPRESSION: Minimal left periorbital/facial soft tissue swelling could reflect cellulitis without abscess or postseptal involvement.  Poor dentition with periapical abscess.   Electronically Signed   By: Awilda Metro   On: 09/11/2013 23:31      MDM   1. Periapical abscess   2. Periorbital cellulitis of left eye      I personally performed the services described in this documentation, which was scribed in my presence. The recorded information has been reviewed and is accurate.    Angus Seller, PA-C 09/13/13 [phone]

## 2013-09-11 NOTE — ED Notes (Signed)
Pt presents with some facial and oral swelling x 2 days.

## 2013-09-11 NOTE — ED Notes (Signed)
Patient transported to CT 

## 2013-09-11 NOTE — ED Notes (Addendum)
Top left tooth decayed, swelling of jaw, eye puffy, pt states the swelling took place fairly quick. Spoke with his Dr who told him to come to the ED

## 2013-09-11 NOTE — ED Notes (Signed)
Assumed care of patient Patient with swelling r/t dental carries Patient able to speak in full, complete sentences without difficulty--able to handle secretions Patient in NAD

## 2013-09-12 MED ORDER — CLINDAMYCIN HCL 300 MG PO CAPS: 300.0000 mg | ORAL_CAPSULE | Freq: Four times a day (QID) | ORAL | Status: DC

## 2013-09-13 NOTE — ED Provider Notes (Signed)
Medical screening examination/treatment/procedure(s) were performed by non-physician practitioner and as supervising physician I was immediately available for consultation/collaboration.  EKG Interpretation   None         Junius Argyle, MD 09/13/13 1026

## 2013-09-14 DIAGNOSIS — R6884 Jaw pain: Secondary | ICD-10-CM | POA: Diagnosis not present

## 2013-09-14 DIAGNOSIS — K047 Periapical abscess without sinus: Secondary | ICD-10-CM | POA: Diagnosis not present

## 2013-09-14 DIAGNOSIS — Z6827 Body mass index (BMI) 27.0-27.9, adult: Secondary | ICD-10-CM | POA: Diagnosis not present

## 2013-09-30 DIAGNOSIS — F3189 Other bipolar disorder: Secondary | ICD-10-CM | POA: Diagnosis not present

## 2013-11-07 ENCOUNTER — Emergency Department (HOSPITAL_COMMUNITY): Payer: Medicare Other

## 2013-11-07 ENCOUNTER — Observation Stay (HOSPITAL_COMMUNITY)
Admission: EM | Admit: 2013-11-07 | Discharge: 2013-11-08 | Disposition: A | Discharge status: Home / Self Care | Payer: Medicare Other | Attending: Internal Medicine | Admitting: Internal Medicine

## 2013-11-07 ENCOUNTER — Encounter (HOSPITAL_COMMUNITY): Payer: Self-pay | Admitting: Emergency Medicine

## 2013-11-07 DIAGNOSIS — F32A Depression, unspecified: Secondary | ICD-10-CM | POA: Diagnosis present

## 2013-11-07 DIAGNOSIS — E876 Hypokalemia: Secondary | ICD-10-CM | POA: Diagnosis present

## 2013-11-07 DIAGNOSIS — R339 Retention of urine, unspecified: Secondary | ICD-10-CM

## 2013-11-07 DIAGNOSIS — Z609 Problem related to social environment, unspecified: Secondary | ICD-10-CM | POA: Insufficient documentation

## 2013-11-07 DIAGNOSIS — F329 Major depressive disorder, single episode, unspecified: Secondary | ICD-10-CM | POA: Diagnosis present

## 2013-11-07 DIAGNOSIS — D649 Anemia, unspecified: Secondary | ICD-10-CM | POA: Diagnosis present

## 2013-11-07 DIAGNOSIS — R4182 Altered mental status, unspecified: Secondary | ICD-10-CM | POA: Diagnosis not present

## 2013-11-07 DIAGNOSIS — M549 Dorsalgia, unspecified: Secondary | ICD-10-CM | POA: Insufficient documentation

## 2013-11-07 DIAGNOSIS — F17200 Nicotine dependence, unspecified, uncomplicated: Secondary | ICD-10-CM | POA: Insufficient documentation

## 2013-11-07 DIAGNOSIS — E860 Dehydration: Secondary | ICD-10-CM

## 2013-11-07 DIAGNOSIS — G9340 Encephalopathy, unspecified: Principal | ICD-10-CM

## 2013-11-07 DIAGNOSIS — J984 Other disorders of lung: Secondary | ICD-10-CM | POA: Diagnosis not present

## 2013-11-07 DIAGNOSIS — Z720 Tobacco use: Secondary | ICD-10-CM | POA: Diagnosis present

## 2013-11-07 DIAGNOSIS — F411 Generalized anxiety disorder: Secondary | ICD-10-CM | POA: Insufficient documentation

## 2013-11-07 DIAGNOSIS — F333 Major depressive disorder, recurrent, severe with psychotic symptoms: Secondary | ICD-10-CM | POA: Insufficient documentation

## 2013-11-07 DIAGNOSIS — N179 Acute kidney failure, unspecified: Secondary | ICD-10-CM | POA: Diagnosis not present

## 2013-11-07 DIAGNOSIS — T50901A Poisoning by unspecified drugs, medicaments and biological substances, accidental (unintentional), initial encounter: Secondary | ICD-10-CM

## 2013-11-07 DIAGNOSIS — G8929 Other chronic pain: Secondary | ICD-10-CM

## 2013-11-07 DIAGNOSIS — T424X4A Poisoning by benzodiazepines, undetermined, initial encounter: Secondary | ICD-10-CM | POA: Diagnosis not present

## 2013-11-07 DIAGNOSIS — F1910 Other psychoactive substance abuse, uncomplicated: Secondary | ICD-10-CM | POA: Insufficient documentation

## 2013-11-07 DIAGNOSIS — F419 Anxiety disorder, unspecified: Secondary | ICD-10-CM | POA: Diagnosis present

## 2013-11-07 HISTORY — DX: Other psychoactive substance abuse, uncomplicated: F19.10

## 2013-11-07 HISTORY — DX: Depression, unspecified: F32.A

## 2013-11-07 HISTORY — DX: Major depressive disorder, single episode, unspecified: F32.9

## 2013-11-07 HISTORY — DX: Tobacco use: Z72.0

## 2013-11-07 LAB — CBC WITH DIFFERENTIAL/PLATELET
BASOS ABS: 0 10*3/uL (ref 0.0–0.1)
Basophils Relative: 0 % (ref 0–1)
EOS PCT: 0 % (ref 0–5)
Eosinophils Absolute: 0 10*3/uL (ref 0.0–0.7)
HCT: 35.7 % — ABNORMAL LOW (ref 39.0–52.0)
Hemoglobin: 11.8 g/dL — ABNORMAL LOW (ref 13.0–17.0)
LYMPHS PCT: 14 % (ref 12–46)
Lymphs Abs: 1.6 10*3/uL (ref 0.7–4.0)
MCH: 29.8 pg (ref 26.0–34.0)
MCHC: 33.1 g/dL (ref 30.0–36.0)
MCV: 90.2 fL (ref 78.0–100.0)
Monocytes Absolute: 1 10*3/uL (ref 0.1–1.0)
Monocytes Relative: 9 % (ref 3–12)
NEUTROS PCT: 77 % (ref 43–77)
Neutro Abs: 9.1 10*3/uL — ABNORMAL HIGH (ref 1.7–7.7)
PLATELETS: 236 10*3/uL (ref 150–400)
RBC: 3.96 MIL/uL — ABNORMAL LOW (ref 4.22–5.81)
RDW: 13.3 % (ref 11.5–15.5)
WBC: 11.8 10*3/uL — AB (ref 4.0–10.5)

## 2013-11-07 LAB — URINALYSIS, ROUTINE W REFLEX MICROSCOPIC
BILIRUBIN URINE: NEGATIVE
Glucose, UA: NEGATIVE mg/dL
Hgb urine dipstick: NEGATIVE
KETONES UR: NEGATIVE mg/dL
LEUKOCYTES UA: NEGATIVE
Nitrite: NEGATIVE
PROTEIN: NEGATIVE mg/dL
Specific Gravity, Urine: <1.005 — ABNORMAL LOW (ref 1.005–1.030)
UROBILINOGEN UA: 0.2 mg/dL (ref 0.0–1.0)
pH: 6 (ref 5.0–8.0)

## 2013-11-07 LAB — RAPID URINE DRUG SCREEN, HOSP PERFORMED
AMPHETAMINES: NOT DETECTED
Barbiturates: NOT DETECTED
Benzodiazepines: NOT DETECTED
Cocaine: NOT DETECTED
OPIATES: POSITIVE — AB
Tetrahydrocannabinol: POSITIVE — AB

## 2013-11-07 LAB — BASIC METABOLIC PANEL
BUN: 19 mg/dL (ref 6–23)
CALCIUM: 9.4 mg/dL (ref 8.4–10.5)
CO2: 26 meq/L (ref 19–32)
Chloride: 100 mEq/L (ref 96–112)
Creatinine, Ser: 1.31 mg/dL (ref 0.50–1.35)
GFR calc Af Amer: 80 mL/min — ABNORMAL LOW (ref >90)
GFR, EST NON AFRICAN AMERICAN: 69 mL/min — AB (ref >90)
Glucose, Bld: 115 mg/dL — ABNORMAL HIGH (ref 70–99)
Potassium: 3.4 mEq/L — ABNORMAL LOW (ref 3.7–5.3)
SODIUM: 139 meq/L (ref 137–147)

## 2013-11-07 LAB — SALICYLATE LEVEL

## 2013-11-07 LAB — HEPATIC FUNCTION PANEL
ALT: 29 U/L (ref 0–53)
AST: 15 U/L (ref 0–37)
Albumin: 4.3 g/dL (ref 3.5–5.2)
Alkaline Phosphatase: 137 U/L — ABNORMAL HIGH (ref 39–117)
BILIRUBIN TOTAL: 0.2 mg/dL — AB (ref 0.3–1.2)
Total Protein: 7.8 g/dL (ref 6.0–8.3)

## 2013-11-07 LAB — ACETAMINOPHEN LEVEL

## 2013-11-07 LAB — ETHANOL

## 2013-11-07 MED ORDER — NICOTINE 14 MG/24HR TD PT24
14.0000 mg | MEDICATED_PATCH | Freq: Every day | TRANSDERMAL | Status: DC
  Administered 2013-11-07 – 2013-11-08 (×2): 14 mg via TRANSDERMAL
  Filled 2013-11-07 (×2): qty 1

## 2013-11-07 MED ORDER — SODIUM CHLORIDE 0.9 % IV BOLUS (SEPSIS)
1000.0000 mL | Freq: Once | INTRAVENOUS | Status: AC
  Administered 2013-11-07: 1000 mL via INTRAVENOUS

## 2013-11-07 MED ORDER — SODIUM CHLORIDE 0.9 % IJ SOLN: 3.0000 mL | Freq: Two times a day (BID) | INTRAMUSCULAR | Status: DC

## 2013-11-07 MED ORDER — MORPHINE SULFATE ER 30 MG PO TBCR
30.0000 mg | EXTENDED_RELEASE_TABLET | Freq: Two times a day (BID) | ORAL | Status: DC
  Administered 2013-11-07 – 2013-11-08 (×2): 30 mg via ORAL
  Filled 2013-11-07 (×2): qty 1

## 2013-11-07 MED ORDER — ALBUTEROL SULFATE (2.5 MG/3ML) 0.083% IN NEBU: 2.5000 mg | INHALATION_SOLUTION | RESPIRATORY_TRACT | Status: DC | PRN

## 2013-11-07 MED ORDER — CLONAZEPAM 0.5 MG PO TABS
1.0000 mg | ORAL_TABLET | Freq: Three times a day (TID) | ORAL | Status: DC
  Administered 2013-11-07 – 2013-11-08 (×4): 1 mg via ORAL
  Filled 2013-11-07 (×4): qty 2

## 2013-11-07 MED ORDER — DOCUSATE SODIUM 100 MG PO CAPS
100.0000 mg | ORAL_CAPSULE | Freq: Two times a day (BID) | ORAL | Status: DC
  Administered 2013-11-07 – 2013-11-08 (×2): 100 mg via ORAL
  Filled 2013-11-07 (×6): qty 1

## 2013-11-07 MED ORDER — POTASSIUM CHLORIDE IN NACL 20-0.9 MEQ/L-% IV SOLN
INTRAVENOUS | Status: AC
  Administered 2013-11-07 – 2013-11-08 (×2): via INTRAVENOUS

## 2013-11-07 MED ORDER — MORPHINE SULFATE 2 MG/ML IJ SOLN: 2.0000 mg | INTRAMUSCULAR | Status: DC | PRN

## 2013-11-07 MED ORDER — ONDANSETRON HCL 4 MG/2ML IJ SOLN: 4.0000 mg | Freq: Four times a day (QID) | INTRAMUSCULAR | Status: DC | PRN

## 2013-11-07 MED ORDER — HYDROCODONE-ACETAMINOPHEN 5-325 MG PO TABS
1.0000 | ORAL_TABLET | ORAL | Status: DC | PRN
  Administered 2013-11-08: 2 via ORAL
  Filled 2013-11-07: qty 2

## 2013-11-07 MED ORDER — POTASSIUM CHLORIDE CRYS ER 20 MEQ PO TBCR
30.0000 meq | EXTENDED_RELEASE_TABLET | Freq: Once | ORAL | Status: AC
  Administered 2013-11-07: 30 meq via ORAL
  Filled 2013-11-07 (×2): qty 1

## 2013-11-07 MED ORDER — ACETAMINOPHEN 650 MG RE SUPP: 650.0000 mg | Freq: Four times a day (QID) | RECTAL | Status: DC | PRN

## 2013-11-07 MED ORDER — ONDANSETRON HCL 4 MG PO TABS: 4.0000 mg | ORAL_TABLET | Freq: Four times a day (QID) | ORAL | Status: DC | PRN

## 2013-11-07 MED ORDER — ACETAMINOPHEN 325 MG PO TABS: 650.0000 mg | ORAL_TABLET | Freq: Four times a day (QID) | ORAL | Status: DC | PRN
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
## 2013-11-07 NOTE — ED Notes (Signed)
EMS reports was called out for confusion.  Family reports thinks that pt is abusing his medications.  Reports pt sleeps all day and stays up all night.  EMS reported pt's sister said she also has some xanax missing.  Pt had 120 vicodin filled on 2/20 and  91 are left.  Pt says has taken 3 vicodin and some flexeril this morning for chronic back pain.  Pt denies SI or HI.   Pt states "no, I like everyone in here."  Pt answers some questions appropriately but then looses track of what he is saying.  Asked pt what medications he took today and he said, "Vicodin, flexeril, lettuce, and the movie [place]."    Pt restless, disoriented to day of the week.  Mother at bedside.
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
## 2013-11-07 NOTE — ED Notes (Signed)
Mother spoke with her daughter and she has Robaxin 500 mg missing approximately 10-15 tabs gone.

## 2013-11-07 NOTE — ED Notes (Signed)
Neuro assessment normal.

## 2013-11-08 ENCOUNTER — Encounter (HOSPITAL_COMMUNITY): Payer: Self-pay | Admitting: Family

## 2013-11-08 DIAGNOSIS — E860 Dehydration: Secondary | ICD-10-CM

## 2013-11-08 DIAGNOSIS — R4182 Altered mental status, unspecified: Secondary | ICD-10-CM

## 2013-11-08 DIAGNOSIS — G9340 Encephalopathy, unspecified: Secondary | ICD-10-CM | POA: Diagnosis not present

## 2013-11-08 DIAGNOSIS — T50901A Poisoning by unspecified drugs, medicaments and biological substances, accidental (unintentional), initial encounter: Secondary | ICD-10-CM

## 2013-11-08 DIAGNOSIS — M549 Dorsalgia, unspecified: Secondary | ICD-10-CM | POA: Diagnosis not present

## 2013-11-08 DIAGNOSIS — N179 Acute kidney failure, unspecified: Secondary | ICD-10-CM

## 2013-11-08 DIAGNOSIS — G8929 Other chronic pain: Secondary | ICD-10-CM

## 2013-11-08 LAB — BASIC METABOLIC PANEL
BUN: 9 mg/dL (ref 6–23)
CHLORIDE: 111 meq/L (ref 96–112)
CO2: 27 mEq/L (ref 19–32)
CREATININE: 0.96 mg/dL (ref 0.50–1.35)
Calcium: 8.8 mg/dL (ref 8.4–10.5)
GFR calc Af Amer: >90 mL/min (ref >90)
GFR calc non Af Amer: >90 mL/min (ref >90)
Glucose, Bld: 100 mg/dL — ABNORMAL HIGH (ref 70–99)
Potassium: 4 mEq/L (ref 3.7–5.3)
Sodium: 145 mEq/L (ref 137–147)

## 2013-11-08 LAB — CBC
HEMATOCRIT: 35.5 % — AB (ref 39.0–52.0)
Hemoglobin: 11.5 g/dL — ABNORMAL LOW (ref 13.0–17.0)
MCH: 29.3 pg (ref 26.0–34.0)
MCHC: 32.4 g/dL (ref 30.0–36.0)
MCV: 90.3 fL (ref 78.0–100.0)
Platelets: 233 10*3/uL (ref 150–400)
RBC: 3.93 MIL/uL — ABNORMAL LOW (ref 4.22–5.81)
RDW: 13.4 % (ref 11.5–15.5)
WBC: 7 10*3/uL (ref 4.0–10.5)

## 2013-11-08 MED ORDER — CYCLOBENZAPRINE HCL 10 MG PO TABS: 20.0000 mg | ORAL_TABLET | Freq: Three times a day (TID) | ORAL | Status: DC | PRN

## 2013-11-08 MED ORDER — CYCLOBENZAPRINE HCL 10 MG PO TABS
20.0000 mg | ORAL_TABLET | Freq: Three times a day (TID) | ORAL | Status: DC | PRN
  Administered 2013-11-08: 20 mg via ORAL
  Filled 2013-11-08: qty 2
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
## 2013-11-08 NOTE — Discharge Instructions (Signed)
Polypharmacy Problems °Polypharmacy problems can occur when you take more than one medicine. Your caregivers need to know about all the medicines you take. This includes vitamins, herbs, eyedrops, over-the-counter medicines, prescription medicines, and creams. Drug interaction problems can include: °· Bad reactions or side effects. This can occur when certain drugs are taken together. °· Reduced benefit from your medicines. This can occur if one drug reduces the ability of another drug to work. °Some drug interaction problems can be life-threatening. Your caregivers can coordinate a plan to help you avoid polypharmacy problems. °RISK FACTORS °Polypharmacy problems are more likely to occur if: °· You have many caregivers prescribing different medicines for you. This is a common problem for elderly patients. °· You have a long-term (chronic) medical condition. °· You have had an organ transplant. °· You have human immunodeficiency virus (HIV). °· You are undergoing chemotherapy. °· You have hepatitis. °· You have kidney disease or kidney failure. °· You have significant heart disease or lung disease. °· You are elderly. °· You are taking medicines that have a low margin of error. This means there is little difference between the right amount of medicine and too much medicine. Medicines in this category include: °· Warfarin. °· Digoxin. °· Lithium. °· Theophylline. °· Monoamine oxidase (MAO) inhibitors. °· Seizure medicines. °· Cyclosporine. °PREVENTION  °· Choose one caregiver to be in charge of coordinating your medicines. Inform this caregiver about all medicines and supplements you take, even if they are prescribed by another caregiver. °· Purchase all your medicines through the same pharmacy. The pharmacy keeps track of your medicines and possible drug interactions. They can notify your caregiver of potential problems. °· Read the information given to you at your pharmacy. °· Carry a list of all your medicines and  their doses with you. This informs your caregivers, emergency department caregivers, and specialists about the medicines you are taking. This is especially important before you start a new medicine. °· Use a system to keep track of which medicines you are taking and when. Many patients use a pillbox that separates medicines by the day and time they are supposed to be taken. °· Carry a list of your chronic medical problems with you. Conditions such as kidney problems, liver problems, organ transplants, and chronic viral infections affect the way your body handles medicines. °· Ask your caregiver or pharmacist if you have any questions about your medicines. °SEEK MEDICAL CARE IF: °· You have any problems that may be caused by your medicines. °· You have chest pain. °· You have shortness of breath. °· You have an irregular heartbeat. °· You have fainting spells. °· You have shaking or tremors. °· You have weakness or tiredness (lethargy). °· You have a rash or swelling in any part of the body. °· You have increased bleeding, rectal bleeding, vaginal bleeding, or you bruise easily. °· You have abdominal pain. °· You have nausea. °· You have vomiting. °Document Released: 10/10/2004 Document Revised: 11/25/2011 Document Reviewed: 05/22/2011 °[employer]® Patient Information ©2014 [employer]. ° °

## 2013-11-08 NOTE — Progress Notes (Signed)
Information given for the psych. Consult.  Awaiting call return.  Voiced to the accepting scheduler that the patient preferred for the patient to see an mid level or MD instead of an act team person.  She stated that she would place in the referral but there were no guarantees.   She stated that she would be calling back with time of evaluation.
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
## 2013-11-08 NOTE — Treatment Plan (Signed)
  Approx.   1530 had a conversation with John C. Withrow the NP at [facility] about the patients care.  He stated that the patient's symptoms seem to be related to mania and that the patient did not require IVC placement.  He stated that the he was cleared from the psychiatric perspective and that the IVC papers should be re-sended.  Approx. 1600 I discussed this with Dr. Kerry HoughMemon and took the IVC papers to him to fill them out.    Patient was then discharged with instructions, prescriptions, and care notes.  He and his father verbalized understanding via teach back.  Patient left the floor via ambulating with staff in family and in stable condition.
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
## 2013-11-08 NOTE — Progress Notes (Signed)
Writer informed the nurse Fredonia Highland(Tamika) regarding the Tele Psych  Scheduled for 3:15pm

## 2013-11-08 NOTE — Progress Notes (Signed)
Writer spoke to Richard Benitez (CSW) regarding the need for a Tele Psych for the patient.  The patient is now IVC'd by his father.

## 2013-11-08 NOTE — Progress Notes (Signed)
Approx. 1300  Notified Dr. Kerry HoughMemon that the patients father was here and he stated he had went to the court house and filed involuntary commitment papers on his son.  He verbalized he would like to speak with him and possibly the psychiatrist.  Dr. Kerry HoughMemon came to the floor and spoke with the family.  Approx 1330 [place] [facility] [facility] delivered the signed commitment papers and I placed them on the chart.  I also verbalized to Dr. Kerry HoughMemon the delivery of the commitment papers involuntary.

## 2013-12-08 DIAGNOSIS — F3189 Other bipolar disorder: Secondary | ICD-10-CM | POA: Diagnosis not present

## 2013-12-29 DIAGNOSIS — F3189 Other bipolar disorder: Secondary | ICD-10-CM | POA: Diagnosis not present

## 2014-01-03 DIAGNOSIS — Z79899 Other long term (current) drug therapy: Secondary | ICD-10-CM | POA: Diagnosis not present

## 2014-01-03 DIAGNOSIS — Z6826 Body mass index (BMI) 26.0-26.9, adult: Secondary | ICD-10-CM | POA: Diagnosis not present

## 2014-01-03 DIAGNOSIS — M549 Dorsalgia, unspecified: Secondary | ICD-10-CM | POA: Diagnosis not present

## 2014-01-06 DIAGNOSIS — F3189 Other bipolar disorder: Secondary | ICD-10-CM | POA: Diagnosis not present

## 2014-01-25 DIAGNOSIS — F3189 Other bipolar disorder: Secondary | ICD-10-CM | POA: Diagnosis not present

## 2014-02-01 DIAGNOSIS — F329 Major depressive disorder, single episode, unspecified: Secondary | ICD-10-CM | POA: Diagnosis not present

## 2014-02-01 DIAGNOSIS — M549 Dorsalgia, unspecified: Secondary | ICD-10-CM | POA: Diagnosis not present

## 2014-02-01 DIAGNOSIS — F3289 Other specified depressive episodes: Secondary | ICD-10-CM | POA: Diagnosis not present

## 2014-02-01 DIAGNOSIS — Z79899 Other long term (current) drug therapy: Secondary | ICD-10-CM | POA: Diagnosis not present

## 2014-02-01 DIAGNOSIS — Z6827 Body mass index (BMI) 27.0-27.9, adult: Secondary | ICD-10-CM | POA: Diagnosis not present

## 2014-02-02 DIAGNOSIS — F3189 Other bipolar disorder: Secondary | ICD-10-CM | POA: Diagnosis not present

## 2014-02-27 IMAGING — CT CT MAXILLOFACIAL W/ CM
1 series · 15 of 30 positions shown, 19 images · IV contrast (omnipaque)
Comparison: CT of the head July 26, 2008.

CLINICAL DATA: Facial and left eye swelling, on antibiotics.

EXAM:
CT MAXILLOFACIAL WITH CONTRAST
TECHNIQUE: Multidetector CT imaging of the maxillofacial structures was
performed with intravenous contrast. Multiplanar CT image
reconstructions were also generated. A small metallic BB was placed
on the right temple in order to reliably differentiate right from
left.
CONTRAST:  100mL OMNIPAQUE IOHEXOL 300 MG/ML  SOLN

[Series 3: facial st · axial · 0.34mm/px · z∈[+1130,+1284]mm · 15 of 83 slices shown, 19 images]
[im 3/83  brain]
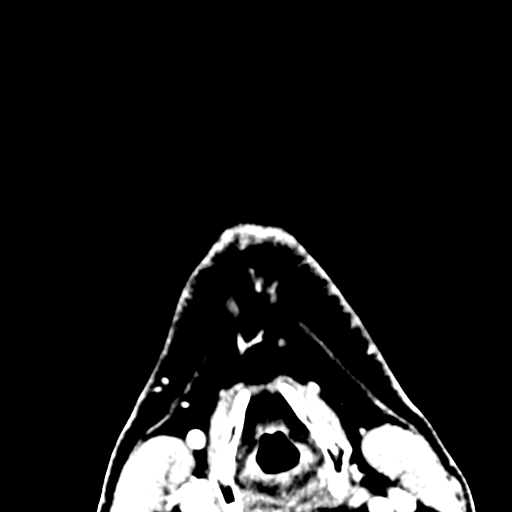
[im 3/83  bone]
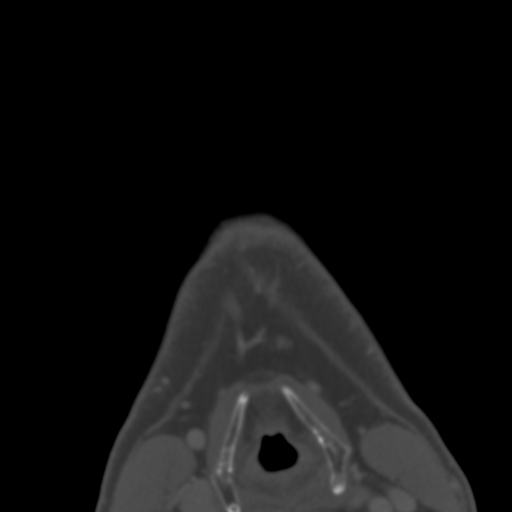
[im 9/83  bone]
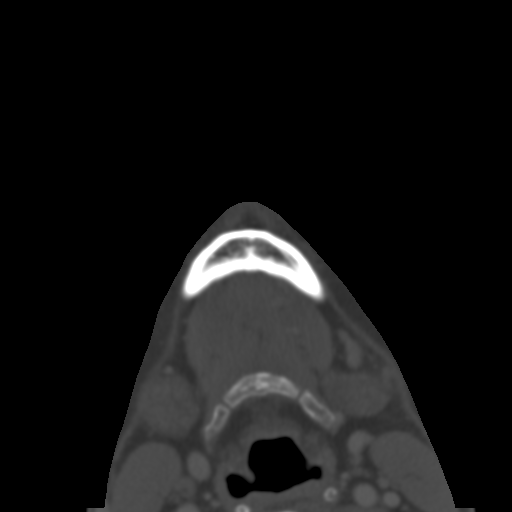
[im 15/83  bone]
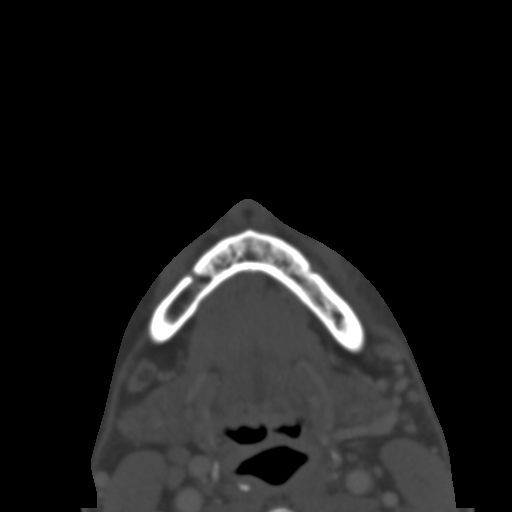
[im 20/83  bone]
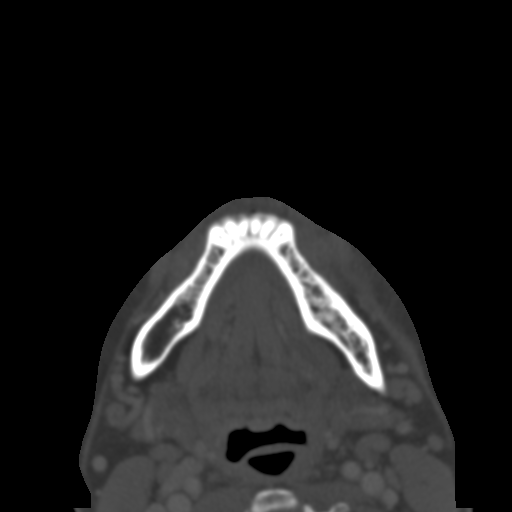
[im 26/83  brain]
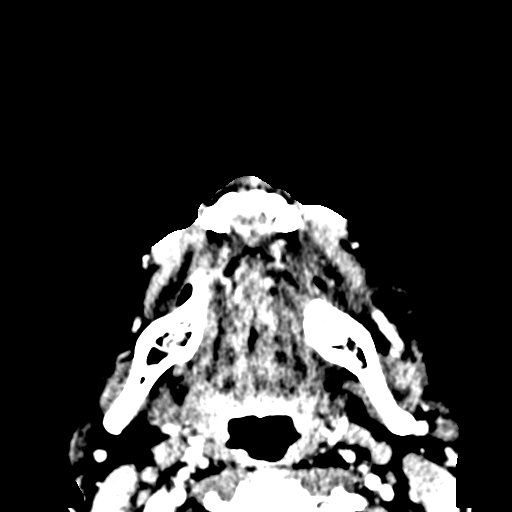
[im 26/83  bone]
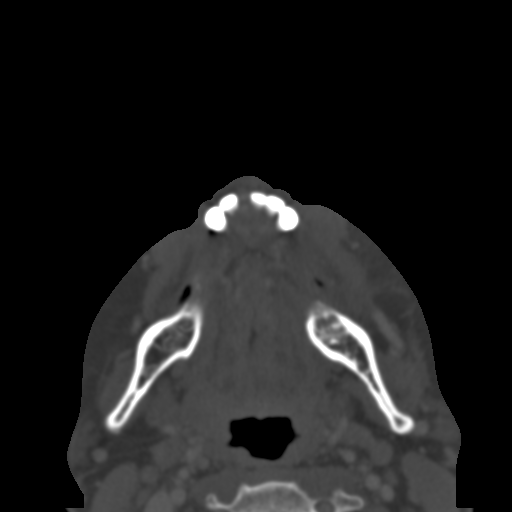
[im 32/83  bone]
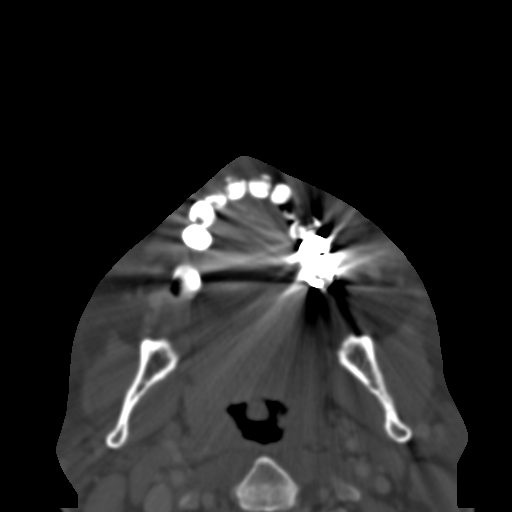
[im 37/83  bone]
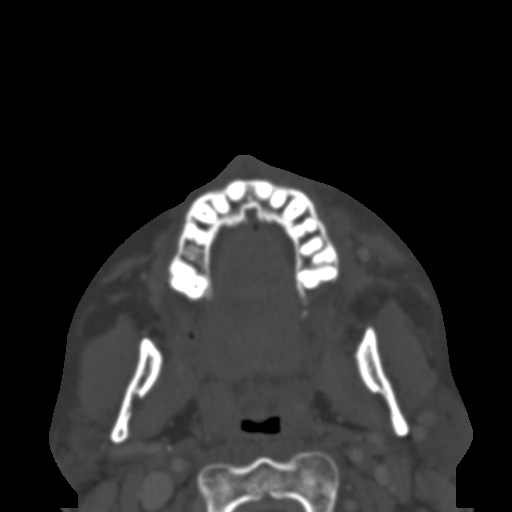
[im 43/83  bone]
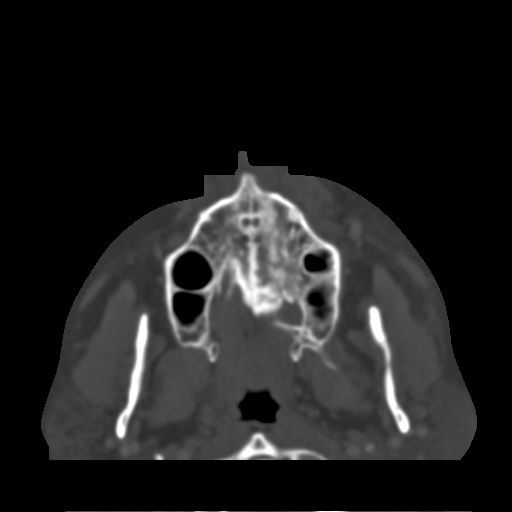
[im 46/83  brain]
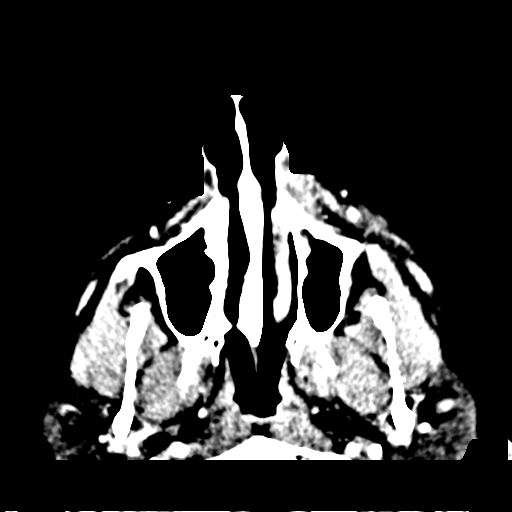
[im 46/83  bone]
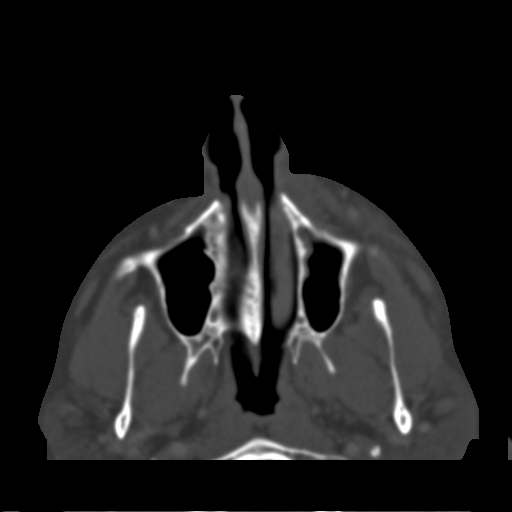
[im 51/83  bone]
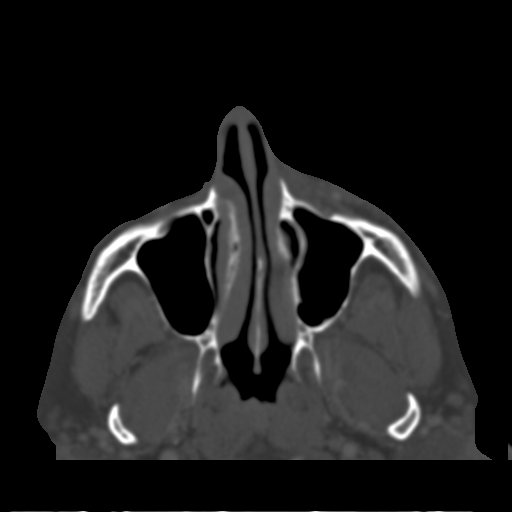
[im 57/83  bone]
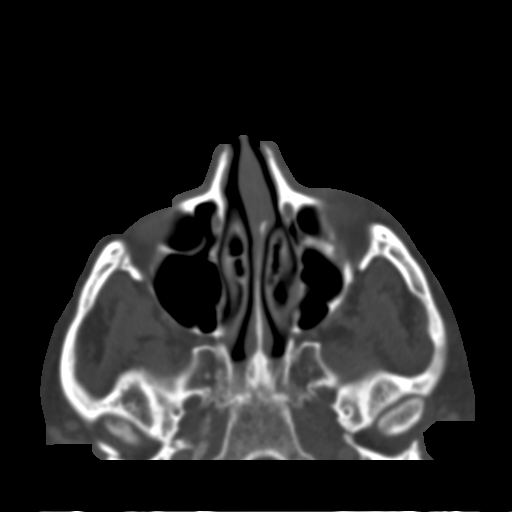
[im 63/83  bone]
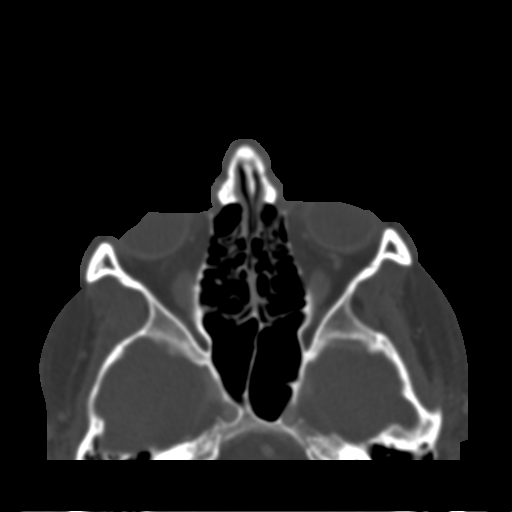
[im 68/83  brain]
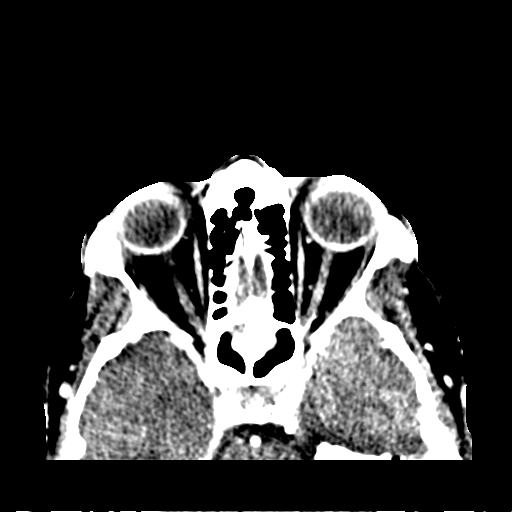
[im 68/83  bone]
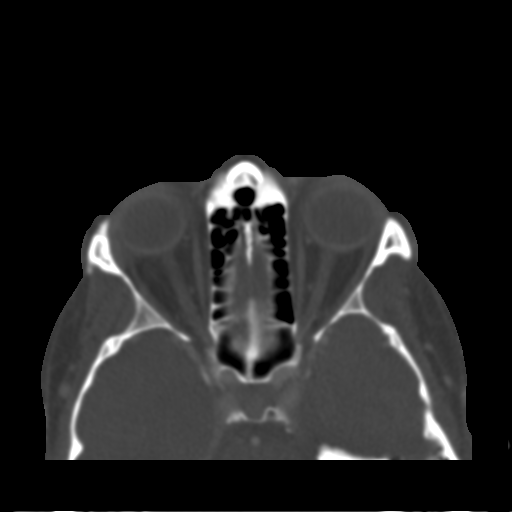
[im 74/83  bone]
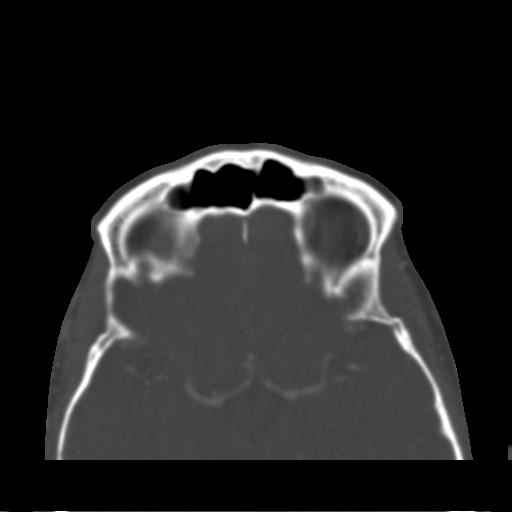
[im 80/83  bone]
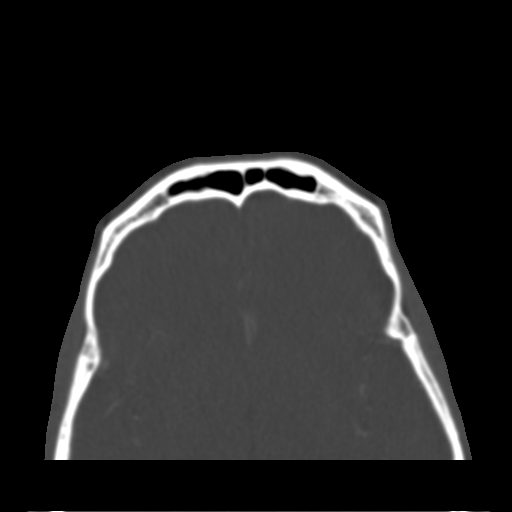

[15 of 30 positions shown; findings below may reference images not displayed]

FINDINGS: Minimal left periorbital/facial soft tissue swelling without
subcutaneous gas, radiopaque foreign bodies, free fluid or abscess.
Ocular globes are well formed and located. Preservation orbital fat.
Optic nerve sheath complexes are unremarkable. Extraocular muscles
are well formed and located, symmetric in appearance. Superior
ophthalmic veins are not enlarged.

Trace paranasal sinus mucosal thickening, no air-fluid levels.
Visualized mastoid air cells are well aerated. No destructive bony
lesions. Left mandible incisor periapical lucency (approximate tooth
number 24). Additional caries noted. Included airway is widely
patent.
IMPRESSION: Minimal left periorbital/facial soft tissue swelling could reflect
cellulitis without abscess or postseptal involvement.

Poor dentition with periapical abscess.

  By: Rubina Mancini

## 2014-03-03 DIAGNOSIS — M549 Dorsalgia, unspecified: Secondary | ICD-10-CM | POA: Diagnosis not present

## 2014-03-03 DIAGNOSIS — Z6827 Body mass index (BMI) 27.0-27.9, adult: Secondary | ICD-10-CM | POA: Diagnosis not present

## 2014-03-03 DIAGNOSIS — Z79899 Other long term (current) drug therapy: Secondary | ICD-10-CM | POA: Diagnosis not present

## 2014-03-03 DIAGNOSIS — F329 Major depressive disorder, single episode, unspecified: Secondary | ICD-10-CM | POA: Diagnosis not present

## 2014-03-03 DIAGNOSIS — F3289 Other specified depressive episodes: Secondary | ICD-10-CM | POA: Diagnosis not present

## 2014-03-03 DIAGNOSIS — L259 Unspecified contact dermatitis, unspecified cause: Secondary | ICD-10-CM | POA: Diagnosis not present

## 2014-03-24 DIAGNOSIS — F3189 Other bipolar disorder: Secondary | ICD-10-CM | POA: Diagnosis not present

## 2014-05-09 DIAGNOSIS — M549 Dorsalgia, unspecified: Secondary | ICD-10-CM | POA: Diagnosis not present

## 2014-05-09 DIAGNOSIS — J309 Allergic rhinitis, unspecified: Secondary | ICD-10-CM | POA: Diagnosis not present

## 2014-05-09 DIAGNOSIS — F17200 Nicotine dependence, unspecified, uncomplicated: Secondary | ICD-10-CM | POA: Diagnosis not present

## 2014-05-09 DIAGNOSIS — J069 Acute upper respiratory infection, unspecified: Secondary | ICD-10-CM | POA: Diagnosis not present

## 2014-05-09 DIAGNOSIS — Z6826 Body mass index (BMI) 26.0-26.9, adult: Secondary | ICD-10-CM | POA: Diagnosis not present

## 2014-05-09 DIAGNOSIS — Z79899 Other long term (current) drug therapy: Secondary | ICD-10-CM | POA: Diagnosis not present

## 2014-06-10 DIAGNOSIS — F17200 Nicotine dependence, unspecified, uncomplicated: Secondary | ICD-10-CM | POA: Diagnosis not present

## 2014-06-10 DIAGNOSIS — M549 Dorsalgia, unspecified: Secondary | ICD-10-CM | POA: Diagnosis not present

## 2014-06-10 DIAGNOSIS — J069 Acute upper respiratory infection, unspecified: Secondary | ICD-10-CM | POA: Diagnosis not present

## 2014-06-10 DIAGNOSIS — Z79899 Other long term (current) drug therapy: Secondary | ICD-10-CM | POA: Diagnosis not present

## 2014-06-10 DIAGNOSIS — Z6826 Body mass index (BMI) 26.0-26.9, adult: Secondary | ICD-10-CM | POA: Diagnosis not present

## 2014-06-16 DIAGNOSIS — F3181 Bipolar II disorder: Secondary | ICD-10-CM | POA: Diagnosis not present

## 2014-07-04 DIAGNOSIS — K219 Gastro-esophageal reflux disease without esophagitis: Secondary | ICD-10-CM | POA: Diagnosis not present

## 2014-07-04 DIAGNOSIS — T148 Other injury of unspecified body region: Secondary | ICD-10-CM | POA: Diagnosis not present

## 2014-07-04 DIAGNOSIS — Z720 Tobacco use: Secondary | ICD-10-CM | POA: Diagnosis not present

## 2014-07-04 DIAGNOSIS — Z6826 Body mass index (BMI) 26.0-26.9, adult: Secondary | ICD-10-CM | POA: Diagnosis not present

## 2014-07-04 DIAGNOSIS — F329 Major depressive disorder, single episode, unspecified: Secondary | ICD-10-CM | POA: Diagnosis not present

## 2014-07-04 DIAGNOSIS — J309 Allergic rhinitis, unspecified: Secondary | ICD-10-CM | POA: Diagnosis not present

## 2014-07-04 DIAGNOSIS — M5489 Other dorsalgia: Secondary | ICD-10-CM | POA: Diagnosis not present

## 2014-08-08 DIAGNOSIS — Z6825 Body mass index (BMI) 25.0-25.9, adult: Secondary | ICD-10-CM | POA: Diagnosis not present

## 2014-08-08 DIAGNOSIS — Z79899 Other long term (current) drug therapy: Secondary | ICD-10-CM | POA: Diagnosis not present

## 2014-08-08 DIAGNOSIS — M5489 Other dorsalgia: Secondary | ICD-10-CM | POA: Diagnosis not present

## 2014-08-08 DIAGNOSIS — Z720 Tobacco use: Secondary | ICD-10-CM | POA: Diagnosis not present

## 2014-08-31 DIAGNOSIS — F419 Anxiety disorder, unspecified: Secondary | ICD-10-CM | POA: Diagnosis not present

## 2014-08-31 DIAGNOSIS — F3181 Bipolar II disorder: Secondary | ICD-10-CM | POA: Diagnosis not present

## 2014-09-06 DIAGNOSIS — Z720 Tobacco use: Secondary | ICD-10-CM | POA: Diagnosis not present

## 2014-09-06 DIAGNOSIS — M5489 Other dorsalgia: Secondary | ICD-10-CM | POA: Diagnosis not present

## 2014-09-06 DIAGNOSIS — Z6826 Body mass index (BMI) 26.0-26.9, adult: Secondary | ICD-10-CM | POA: Diagnosis not present

## 2014-09-06 DIAGNOSIS — G2581 Restless legs syndrome: Secondary | ICD-10-CM | POA: Diagnosis not present

## 2014-09-06 DIAGNOSIS — Z79899 Other long term (current) drug therapy: Secondary | ICD-10-CM | POA: Diagnosis not present

## 2014-10-04 DIAGNOSIS — M5489 Other dorsalgia: Secondary | ICD-10-CM | POA: Diagnosis not present

## 2014-10-04 DIAGNOSIS — G2581 Restless legs syndrome: Secondary | ICD-10-CM | POA: Diagnosis not present

## 2014-10-04 DIAGNOSIS — Z6825 Body mass index (BMI) 25.0-25.9, adult: Secondary | ICD-10-CM | POA: Diagnosis not present

## 2014-10-04 DIAGNOSIS — Z720 Tobacco use: Secondary | ICD-10-CM | POA: Diagnosis not present

## 2014-10-04 DIAGNOSIS — Z79899 Other long term (current) drug therapy: Secondary | ICD-10-CM | POA: Diagnosis not present

## 2014-10-06 ENCOUNTER — Inpatient Hospital Stay (HOSPITAL_COMMUNITY)
Admission: EM | Admit: 2014-10-06 | Discharge: 2014-10-08 | DRG: 917 | Disposition: A | Discharge status: Home / Self Care | Payer: Medicare Other | Attending: Family Medicine | Admitting: Family Medicine

## 2014-10-06 ENCOUNTER — Emergency Department (HOSPITAL_COMMUNITY): Payer: Medicare Other

## 2014-10-06 ENCOUNTER — Encounter (HOSPITAL_COMMUNITY): Payer: Self-pay | Admitting: Emergency Medicine

## 2014-10-06 DIAGNOSIS — T402X4A Poisoning by other opioids, undetermined, initial encounter: Secondary | ICD-10-CM | POA: Diagnosis not present

## 2014-10-06 DIAGNOSIS — G92 Toxic encephalopathy: Secondary | ICD-10-CM | POA: Diagnosis present

## 2014-10-06 DIAGNOSIS — G9340 Encephalopathy, unspecified: Secondary | ICD-10-CM | POA: Diagnosis not present

## 2014-10-06 DIAGNOSIS — J690 Pneumonitis due to inhalation of food and vomit: Secondary | ICD-10-CM | POA: Diagnosis present

## 2014-10-06 DIAGNOSIS — F419 Anxiety disorder, unspecified: Secondary | ICD-10-CM | POA: Diagnosis not present

## 2014-10-06 DIAGNOSIS — G8929 Other chronic pain: Secondary | ICD-10-CM | POA: Diagnosis not present

## 2014-10-06 DIAGNOSIS — N179 Acute kidney failure, unspecified: Secondary | ICD-10-CM | POA: Diagnosis present

## 2014-10-06 DIAGNOSIS — E875 Hyperkalemia: Secondary | ICD-10-CM | POA: Diagnosis present

## 2014-10-06 DIAGNOSIS — D72829 Elevated white blood cell count, unspecified: Secondary | ICD-10-CM | POA: Diagnosis present

## 2014-10-06 DIAGNOSIS — T50901A Poisoning by unspecified drugs, medicaments and biological substances, accidental (unintentional), initial encounter: Secondary | ICD-10-CM | POA: Diagnosis present

## 2014-10-06 DIAGNOSIS — S37009A Unspecified injury of unspecified kidney, initial encounter: Secondary | ICD-10-CM | POA: Diagnosis not present

## 2014-10-06 DIAGNOSIS — R6510 Systemic inflammatory response syndrome (SIRS) of non-infectious origin without acute organ dysfunction: Secondary | ICD-10-CM

## 2014-10-06 DIAGNOSIS — I469 Cardiac arrest, cause unspecified: Secondary | ICD-10-CM | POA: Diagnosis not present

## 2014-10-06 DIAGNOSIS — Z23 Encounter for immunization: Secondary | ICD-10-CM

## 2014-10-06 DIAGNOSIS — T402X1A Poisoning by other opioids, accidental (unintentional), initial encounter: Principal | ICD-10-CM | POA: Diagnosis present

## 2014-10-06 DIAGNOSIS — M549 Dorsalgia, unspecified: Secondary | ICD-10-CM | POA: Diagnosis present

## 2014-10-06 DIAGNOSIS — F1910 Other psychoactive substance abuse, uncomplicated: Secondary | ICD-10-CM | POA: Diagnosis present

## 2014-10-06 DIAGNOSIS — R0602 Shortness of breath: Secondary | ICD-10-CM | POA: Diagnosis not present

## 2014-10-06 DIAGNOSIS — F17200 Nicotine dependence, unspecified, uncomplicated: Secondary | ICD-10-CM | POA: Diagnosis present

## 2014-10-06 DIAGNOSIS — F329 Major depressive disorder, single episode, unspecified: Secondary | ICD-10-CM | POA: Diagnosis present

## 2014-10-06 DIAGNOSIS — R918 Other nonspecific abnormal finding of lung field: Secondary | ICD-10-CM | POA: Diagnosis not present

## 2014-10-06 LAB — CBC WITH DIFFERENTIAL/PLATELET
Basophils Absolute: 0 10*3/uL (ref 0.0–0.1)
Basophils Relative: 0 % (ref 0–1)
Eosinophils Absolute: 0 10*3/uL (ref 0.0–0.7)
Eosinophils Relative: 0 % (ref 0–5)
HCT: 41.2 % (ref 39.0–52.0)
Hemoglobin: 12.9 g/dL — ABNORMAL LOW (ref 13.0–17.0)
Lymphocytes Relative: 5 % — ABNORMAL LOW (ref 12–46)
Lymphs Abs: 1.4 10*3/uL (ref 0.7–4.0)
MCH: 29.7 pg (ref 26.0–34.0)
MCHC: 31.3 g/dL (ref 30.0–36.0)
MCV: 94.7 fL (ref 78.0–100.0)
Monocytes Absolute: 2 10*3/uL — ABNORMAL HIGH (ref 0.1–1.0)
Monocytes Relative: 7 % (ref 3–12)
Neutro Abs: 25.6 10*3/uL — ABNORMAL HIGH (ref 1.7–7.7)
Neutrophils Relative %: 88 % — ABNORMAL HIGH (ref 43–77)
Platelets: 309 10*3/uL (ref 150–400)
RBC: 4.35 MIL/uL (ref 4.22–5.81)
RDW: 14.6 % (ref 11.5–15.5)
WBC: 29 10*3/uL — ABNORMAL HIGH (ref 4.0–10.5)

## 2014-10-06 LAB — URINALYSIS, ROUTINE W REFLEX MICROSCOPIC
Bilirubin Urine: NEGATIVE
Glucose, UA: NEGATIVE mg/dL
Hgb urine dipstick: NEGATIVE
Ketones, ur: NEGATIVE mg/dL
Leukocytes, UA: NEGATIVE
Nitrite: NEGATIVE
Protein, ur: 30 mg/dL — AB
Specific Gravity, Urine: >1.03 — ABNORMAL HIGH (ref 1.005–1.030)
Urobilinogen, UA: 0.2 mg/dL (ref 0.0–1.0)
pH: 5.5 (ref 5.0–8.0)

## 2014-10-06 LAB — RAPID URINE DRUG SCREEN, HOSP PERFORMED
Amphetamines: NOT DETECTED
Barbiturates: NOT DETECTED
Benzodiazepines: POSITIVE — AB
Cocaine: NOT DETECTED
Opiates: POSITIVE — AB
Tetrahydrocannabinol: NOT DETECTED

## 2014-10-06 LAB — COMPREHENSIVE METABOLIC PANEL
ALT: 80 U/L — ABNORMAL HIGH (ref 0–53)
AST: 76 U/L — ABNORMAL HIGH (ref 0–37)
Albumin: 4.2 g/dL (ref 3.5–5.2)
Alkaline Phosphatase: 105 U/L (ref 39–117)
Anion gap: 8 (ref 5–15)
BUN: 21 mg/dL (ref 6–23)
CO2: 27 mmol/L (ref 19–32)
Calcium: 8.3 mg/dL — ABNORMAL LOW (ref 8.4–10.5)
Chloride: 112 mEq/L (ref 96–112)
Creatinine, Ser: 2.02 mg/dL — ABNORMAL HIGH (ref 0.50–1.35)
GFR calc Af Amer: 47 mL/min — ABNORMAL LOW (ref >90)
GFR calc non Af Amer: 40 mL/min — ABNORMAL LOW (ref >90)
Glucose, Bld: 93 mg/dL (ref 70–99)
Potassium: 4.9 mmol/L (ref 3.5–5.1)
Sodium: 147 mmol/L — ABNORMAL HIGH (ref 135–145)
Total Bilirubin: 0.4 mg/dL (ref 0.3–1.2)
Total Protein: 7.2 g/dL (ref 6.0–8.3)

## 2014-10-06 LAB — URINE MICROSCOPIC-ADD ON

## 2014-10-06 LAB — ETHANOL: Alcohol, Ethyl (B): <5 mg/dL (ref 0–9)

## 2014-10-06 LAB — ACETAMINOPHEN LEVEL: Acetaminophen (Tylenol), Serum: <10 ug/mL — ABNORMAL LOW (ref 10–30)

## 2014-10-06 LAB — SALICYLATE LEVEL: Salicylate Lvl: <4 mg/dL (ref 2.8–20.0)

## 2014-10-06 LAB — LACTIC ACID, PLASMA: Lactic Acid, Venous: 1.8 mmol/L (ref 0.5–2.0)

## 2014-10-06 MED ORDER — SODIUM CHLORIDE 0.9 % IV BOLUS (SEPSIS)
1000.0000 mL | Freq: Once | INTRAVENOUS | Status: AC
  Administered 2014-10-06: 1000 mL via INTRAVENOUS

## 2014-10-06 MED ORDER — NALOXONE HCL 1 MG/ML IJ SOLN: 2.0000 mg | Freq: Once | INTRAMUSCULAR | Status: DC

## 2014-10-06 MED ORDER — PIPERACILLIN-TAZOBACTAM 3.375 G IVPB 30 MIN
3.3750 g | Freq: Once | INTRAVENOUS | Status: AC
  Administered 2014-10-06: 3.375 g via INTRAVENOUS
  Filled 2014-10-06: qty 50

## 2014-10-06 MED ORDER — VANCOMYCIN HCL IN DEXTROSE 1-5 GM/200ML-% IV SOLN
1000.0000 mg | Freq: Once | INTRAVENOUS | Status: AC
  Administered 2014-10-06: 1000 mg via INTRAVENOUS
  Filled 2014-10-06: qty 200

## 2014-10-06 MED ORDER — NALOXONE HCL 1 MG/ML IJ SOLN
INTRAMUSCULAR | Status: AC
Start: 2014-10-06 — End: 2014-10-06
  Administered 2014-10-06: 2 mg via INTRAVENOUS
  Filled 2014-10-06: qty 2

## 2014-10-06 MED ORDER — NALOXONE HCL 1 MG/ML IJ SOLN
2.0000 mg | Freq: Once | INTRAMUSCULAR | Status: AC
  Administered 2014-10-06: 2 mg via INTRAVENOUS
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
## 2014-10-06 NOTE — ED Notes (Signed)
Family at bedside. 

## 2014-10-06 NOTE — ED Notes (Signed)
Blood cultures drawn x2 prior to antibiotic administration. Pt. Alert and sitting up in bed. Pt. Speaking with family member.

## 2014-10-06 NOTE — ED Notes (Addendum)
Pt. 83% on room air on arrival. Pt. Was Placed on 6L [place] on arrival. [facility]

## 2014-10-06 NOTE — ED Notes (Signed)
Per [facility], patient was found unresponsive by family.  Family began CPR and fire department did 2 minutes of CPR upon arrival.  Patient then began gasping for breath and CPR was stopped.  Patient is alert on arrival.  Patient was given Narcan 0.5mg  IO en route.  Per [facility], patient had Morphine 60mg  tablets filled today; 7 tablets are missing.  Pupils are pinpoint; patient answers questions, but states he has a hard time hearing.  Patient is tachycardic on monitor.

## 2014-10-06 NOTE — ED Notes (Signed)
Pt. O2 decreased to 4L. O2 sat 97%.

## 2014-10-07 ENCOUNTER — Encounter (HOSPITAL_COMMUNITY): Payer: Self-pay | Admitting: *Deleted

## 2014-10-07 DIAGNOSIS — A419 Sepsis, unspecified organism: Secondary | ICD-10-CM

## 2014-10-07 DIAGNOSIS — G8929 Other chronic pain: Secondary | ICD-10-CM

## 2014-10-07 DIAGNOSIS — D72829 Elevated white blood cell count, unspecified: Secondary | ICD-10-CM

## 2014-10-07 DIAGNOSIS — M549 Dorsalgia, unspecified: Secondary | ICD-10-CM

## 2014-10-07 DIAGNOSIS — R0602 Shortness of breath: Secondary | ICD-10-CM

## 2014-10-07 DIAGNOSIS — F419 Anxiety disorder, unspecified: Secondary | ICD-10-CM

## 2014-10-07 DIAGNOSIS — G9340 Encephalopathy, unspecified: Secondary | ICD-10-CM

## 2014-10-07 DIAGNOSIS — F1910 Other psychoactive substance abuse, uncomplicated: Secondary | ICD-10-CM

## 2014-10-07 DIAGNOSIS — N179 Acute kidney failure, unspecified: Secondary | ICD-10-CM

## 2014-10-07 DIAGNOSIS — R6510 Systemic inflammatory response syndrome (SIRS) of non-infectious origin without acute organ dysfunction: Secondary | ICD-10-CM | POA: Insufficient documentation

## 2014-10-07 DIAGNOSIS — T50901A Poisoning by unspecified drugs, medicaments and biological substances, accidental (unintentional), initial encounter: Secondary | ICD-10-CM

## 2014-10-07 LAB — CBC
HCT: 39.3 % (ref 39.0–52.0)
Hemoglobin: 12.4 g/dL — ABNORMAL LOW (ref 13.0–17.0)
MCH: 30 pg (ref 26.0–34.0)
MCHC: 31.6 g/dL (ref 30.0–36.0)
MCV: 95.2 fL (ref 78.0–100.0)
Platelets: 312 10*3/uL (ref 150–400)
RBC: 4.13 MIL/uL — AB (ref 4.22–5.81)
RDW: 15 % (ref 11.5–15.5)
WBC: 29.2 10*3/uL — AB (ref 4.0–10.5)

## 2014-10-07 LAB — COMPREHENSIVE METABOLIC PANEL
ALBUMIN: 4.1 g/dL (ref 3.5–5.2)
ALK PHOS: 84 U/L (ref 39–117)
ALT: 69 U/L — ABNORMAL HIGH (ref 0–53)
ANION GAP: 7 (ref 5–15)
AST: 61 U/L — ABNORMAL HIGH (ref 0–37)
BUN: 24 mg/dL — ABNORMAL HIGH (ref 6–23)
CHLORIDE: 111 meq/L (ref 96–112)
CO2: 24 mmol/L (ref 19–32)
Calcium: 7.6 mg/dL — ABNORMAL LOW (ref 8.4–10.5)
Creatinine, Ser: 1.82 mg/dL — ABNORMAL HIGH (ref 0.50–1.35)
GFR calc Af Amer: 53 mL/min — ABNORMAL LOW (ref >90)
GFR calc non Af Amer: 46 mL/min — ABNORMAL LOW (ref >90)
GLUCOSE: 82 mg/dL (ref 70–99)
Potassium: 6.2 mmol/L (ref 3.5–5.1)
SODIUM: 142 mmol/L (ref 135–145)
Total Bilirubin: 0.6 mg/dL (ref 0.3–1.2)
Total Protein: 7.1 g/dL (ref 6.0–8.3)

## 2014-10-07 LAB — POCT I-STAT TROPONIN I: Troponin i, poc: 0.06 ng/mL (ref 0.00–0.08)

## 2014-10-07 LAB — MRSA PCR SCREENING: MRSA by PCR: NEGATIVE

## 2014-10-07 LAB — CK: Total CK: 147 U/L (ref 7–232)

## 2014-10-07 MED ORDER — VANCOMYCIN HCL IN DEXTROSE 1-5 GM/200ML-% IV SOLN
1000.0000 mg | Freq: Two times a day (BID) | INTRAVENOUS | Status: DC
  Administered 2014-10-07 – 2014-10-08 (×2): 1000 mg via INTRAVENOUS
  Filled 2014-10-07 (×7): qty 200

## 2014-10-07 MED ORDER — PIPERACILLIN-TAZOBACTAM 3.375 G IVPB
INTRAVENOUS | Status: AC
  Filled 2014-10-07: qty 50

## 2014-10-07 MED ORDER — SODIUM CHLORIDE 0.9 % IV SOLN: INTRAVENOUS | Status: DC

## 2014-10-07 MED ORDER — MORPHINE SULFATE ER 15 MG PO TBCR
60.0000 mg | EXTENDED_RELEASE_TABLET | Freq: Two times a day (BID) | ORAL | Status: DC
  Administered 2014-10-07 – 2014-10-08 (×3): 60 mg via ORAL
  Filled 2014-10-07 (×3): qty 4

## 2014-10-07 MED ORDER — ONDANSETRON HCL 4 MG PO TABS: 4.0000 mg | ORAL_TABLET | Freq: Four times a day (QID) | ORAL | Status: DC | PRN

## 2014-10-07 MED ORDER — ACETAMINOPHEN 325 MG PO TABS
650.0000 mg | ORAL_TABLET | Freq: Four times a day (QID) | ORAL | Status: DC | PRN
  Administered 2014-10-07: 650 mg via ORAL
  Filled 2014-10-07: qty 2

## 2014-10-07 MED ORDER — ONDANSETRON HCL 4 MG/2ML IJ SOLN: 4.0000 mg | Freq: Four times a day (QID) | INTRAMUSCULAR | Status: DC | PRN

## 2014-10-07 MED ORDER — PIPERACILLIN-TAZOBACTAM 3.375 G IVPB
3.3750 g | Freq: Three times a day (TID) | INTRAVENOUS | Status: DC
  Administered 2014-10-07 – 2014-10-08 (×4): 3.375 g via INTRAVENOUS
  Filled 2014-10-07 (×11): qty 50

## 2014-10-07 MED ORDER — HYDROCODONE-ACETAMINOPHEN 10-325 MG PO TABS
1.0000 | ORAL_TABLET | Freq: Three times a day (TID) | ORAL | Status: DC | PRN
  Administered 2014-10-07 – 2014-10-08 (×2): 2 via ORAL
  Filled 2014-10-07 (×3): qty 2

## 2014-10-07 MED ORDER — SODIUM CHLORIDE 0.9 % IV BOLUS (SEPSIS)
1000.0000 mL | Freq: Once | INTRAVENOUS | Status: AC
  Administered 2014-10-07: 1000 mL via INTRAVENOUS

## 2014-10-07 MED ORDER — NICOTINE 14 MG/24HR TD PT24
14.0000 mg | MEDICATED_PATCH | Freq: Every day | TRANSDERMAL | Status: DC
  Administered 2014-10-07 – 2014-10-08 (×2): 14 mg via TRANSDERMAL
  Filled 2014-10-07 (×2): qty 1

## 2014-10-07 MED ORDER — SODIUM CHLORIDE 0.9 % IV SOLN
INTRAVENOUS | Status: DC
  Administered 2014-10-07 (×2): via INTRAVENOUS

## 2014-10-07 MED ORDER — CLONAZEPAM 0.5 MG PO TABS
1.0000 mg | ORAL_TABLET | Freq: Three times a day (TID) | ORAL | Status: DC
  Administered 2014-10-07 – 2014-10-08 (×4): 1 mg via ORAL
  Filled 2014-10-07 (×4): qty 2

## 2014-10-07 MED ORDER — ENOXAPARIN SODIUM 40 MG/0.4ML ~~LOC~~ SOLN
40.0000 mg | SUBCUTANEOUS | Status: DC
  Administered 2014-10-07 – 2014-10-08 (×2): 40 mg via SUBCUTANEOUS
  Filled 2014-10-07 (×3): qty 0.4

## 2014-10-07 MED ORDER — SODIUM POLYSTYRENE SULFONATE 15 GM/60ML PO SUSP
30.0000 g | Freq: Once | ORAL | Status: AC
  Administered 2014-10-07: 30 g via ORAL
  Filled 2014-10-07: qty 120

## 2014-10-07 NOTE — Care Management Note (Signed)
    Page 1 of 1   10/07/2014     11:35:11 AM CARE MANAGEMENT NOTE 10/07/2014  Patient:  Richard Benitez,Richard Benitez   Account Number:  [id number]  Date Initiated:  10/07/2014  Documentation initiated by:  Kathyrn SheriffHILDRESS,JESSICA  Subjective/Objective Assessment:   Pt is from home and independent at baseline. Pt plans to return home at discharge. No CM needs identified at.     Action/Plan:   Anticipated DC Date:  10/07/2014   Anticipated DC Plan:  HOME/SELF CARE      DC Planning Services  CM consult      Choice offered to / List presented to:             Status of service:  Completed, signed off Medicare Important Message given?  YES (If response is "NO", the following Medicare IM given date fields will be blank) Date Medicare IM given:  10/07/2014 Medicare IM given by:  Anibal HendersonBOLDEN,GENEVA Date Additional Medicare IM given:   Additional Medicare IM given by:    Discharge Disposition:  HOME/SELF CARE  Per UR Regulation:    If discussed at Long Length of Stay Meetings, dates discussed:    Comments:  10/07/2014 1130 Kathyrn SheriffJessica Childress, RN, MSN, [facility]

## 2014-10-07 NOTE — Progress Notes (Signed)
[facility] PROGRESS NOTE  Richard Benitez WUJ:[id number] DOB: 12/28/1975 DOA: 10/06/2014 PCP: Hoyle SauerAVVA,RAVISANKAR R, MD  Assessment/Plan: 1. Suspected unintentional opiate overdose 1. Presenting AMS likely secondary to opiate overdose 2. Improved with narcan 3. Ovoid overmedication with sedatives 4. Pt still in pain, consider careful resumption of home dosed meds 2. Leukocytosis 1. Afebrile 2. CXR with findings suggestive of PNA 3. On empiric vanc and zosyn 4. Consider possible aspiration from unresponsiveness from above 3. Chronic back pain 1. Treat with analgesics cautiously 4. ARF 1. Improved 2. Cont to encourage hydration 5. Hyperkalemia 1. Will give kayexalate  6. DVTpropylaxis 1. Lovenox subq  Code Status: Full Family Communication: Pt in room, mother at bedside Disposition Plan: Pending   Consultants:    Procedures:    Antibiotics:  Vancomycin 1/21>>>  Zosyn 1/21>>>   HPI/Subjective: Denies suicidal ideation. Reports suspected OD was an accident. Complains of continued back pain  Objective: Filed Vitals:   10/07/14 0600 10/07/14 0700 10/07/14 0738 10/07/14 0800  BP: 119/69 116/78  129/87  Pulse: 103 103  113  Temp:   98.2 F (36.8 C)   TempSrc:   Axillary   Resp: 13 9  14   Height:      Weight:      SpO2: 98% 99%  100%    Intake/Output Summary (Last 24 hours) at 10/07/14 0852 Last data filed at 10/07/14 0600  Gross per 24 hour  Intake 635.42 ml  Output    200 ml  Net 435.42 ml   Filed Weights   10/07/14 0119  Weight: 96.2 kg (212 lb 1.3 oz)    Exam:   General:  Awake, in nad  Cardiovascular: regular, s1, s2  Respiratory: normal resp effort, no wheezing  Abdomen: soft, nondistended  Musculoskeletal: perfused, no clubbing   Data Reviewed: Basic Metabolic Panel:  Recent Labs Lab 10/06/14 2200 10/07/14 0457  NA 147* 142  Benitez 4.9 6.2*  CL 112 111  CO2 27 24  GLUCOSE 93 82  BUN 21 24*  CREATININE 2.02* 1.82*   CALCIUM 8.3* 7.6*   Liver Function Tests:  Recent Labs Lab 10/06/14 2200 10/07/14 0457  AST 76* 61*  ALT 80* 69*  ALKPHOS 105 84  BILITOT 0.4 0.6  PROT 7.2 7.1  ALBUMIN 4.2 4.1   No results for input(s): LIPASE, AMYLASE in the last 168 hours. No results for input(s): AMMONIA in the last 168 hours. CBC:  Recent Labs Lab 10/06/14 2200 10/07/14 0457  WBC 29.0* 29.2*  NEUTROABS 25.6*  --   HGB 12.9* 12.4*  HCT 41.2 39.3  MCV 94.7 95.2  PLT 309 312   Cardiac Enzymes:  Recent Labs Lab 10/07/14 0453  CKTOTAL 147   BNP (last 3 results) No results for input(s): PROBNP in the last 8760 hours. CBG: No results for input(s): GLUCAP in the last 168 hours.  Recent Results (from the past 240 hour(s))  Blood culture (routine x 2)     Status: None (Preliminary result)   Collection Time: 10/06/14 11:10 PM  Result Value Ref Range Status   Specimen Description BLOOD LEFT ARM  Final   Special Requests BOTTLES DRAWN AEROBIC AND ANAEROBIC 8CC EACH  Final   Culture NO GROWTH 1 DAY  Final   Report Status PENDING  Incomplete  Blood culture (routine x 2)     Status: None (Preliminary result)   Collection Time: 10/06/14 11:15 PM  Result Value Ref Range Status   Specimen Description BLOOD RIGHT ANTECUBITAL  Final  Special Requests BOTTLES DRAWN AEROBIC AND ANAEROBIC 8CC EACH  Final   Culture NO GROWTH 1 DAY  Final   Report Status PENDING  Incomplete  MRSA PCR Screening     Status: None   Collection Time: 10/07/14  1:05 AM  Result Value Ref Range Status   MRSA by PCR NEGATIVE NEGATIVE Final    Comment:        The GeneXpert MRSA Assay (FDA approved for NASAL specimens only), is one component of a comprehensive MRSA colonization surveillance program. It is not intended to diagnose MRSA infection nor to guide or monitor treatment for MRSA infections.      Studies: Dg Chest Portable 1 View  10/06/2014   CLINICAL DATA:  Drug overdose tonight. SOB and abnormal WBC currently.  AMS. Pt unable to follow breathing instructions. HX smoker, substance abuse.  EXAM: PORTABLE CHEST - 1 VIEW  COMPARISON:  11/07/2013  FINDINGS: There are low lung volumes. Hazy lung base opacity is noted which may all be atelectasis. Infiltrate is possible. Mild basilar pulmonary edema is possible.  Cardiac silhouette is normal in size. No mediastinal or hilar masses.  Bony thorax is unremarkable.  IMPRESSION: Hazy lung base opacity accentuated by low lung volumes. Findings may reflect bibasilar pneumonia or atelectasis or combination or possibly pulmonary edema.   Electronically Signed   By: Amie Portland M.D.   On: 10/06/2014 23:17    Scheduled Meds: . enoxaparin (LOVENOX) injection  40 mg Subcutaneous Q24H  . piperacillin-tazobactam (ZOSYN)  IV  3.375 g Intravenous Q8H  . vancomycin  1,000 mg Intravenous Q12H   Continuous Infusions: . sodium chloride    . sodium chloride 125 mL/hr at 10/07/14 0600    Principal Problem:   Acute encephalopathy Active Problems:   Back pain, chronic   Anxiety   Substance abuse   AKI (acute kidney injury)   Overdose   Leukocytosis  Time spent:  Richard Benitez  [facility] Pager [phone]. If 7PM-7AM, please contact night-coverage at [email or web address], password [facility] 10/07/2014, 8:52 AM  LOS: 1 day

## 2014-10-07 NOTE — Progress Notes (Signed)
ANTIBIOTIC CONSULT NOTE - INITIAL  Pharmacy Consult for vancomycin & Zosyn Indication: Bacteremia - elevated WBC R/O sepsis  No Known Allergies  Patient Measurements: Height: 6\' 3"  (190.5 cm) Weight: 212 lb 1.3 oz (96.2 kg) IBW/kg (Calculated) : 84.5 Adjusted Body Weight: 88.5kg  Vital Signs: Temp: 97.9 F (36.6 C) (01/22 0119) Temp Source: Oral (01/22 0119) BP: 85/73 mmHg (01/22 0045) Pulse Rate: 114 (01/22 0045) Intake/Output from previous day: 01/21 0701 - 01/22 0700 In: -  Out: 200 [Urine:200] Intake/Output from this shift: Total I/O In: -  Out: 200 [Urine:200]  Labs:  Recent Labs  10/06/14 2200  WBC 29.0*  HGB 12.9*  PLT 309  CREATININE 2.02*   Estimated Creatinine Clearance: 59.8 mL/min (by C-G formula based on Cr of 2.02). No results for input(s): VANCOTROUGH, VANCOPEAK, VANCORANDOM, GENTTROUGH, GENTPEAK, GENTRANDOM, TOBRATROUGH, TOBRAPEAK, TOBRARND, AMIKACINPEAK, AMIKACINTROU, AMIKACIN in the last 72 hours.   Microbiology: No results found for this or any previous visit (from the past 720 hour(s)).  Medical History: Past Medical History  Diagnosis Date  . Back pain, chronic   . Anxiety   . Depression   . Tobacco abuse   . Substance abuse     Medications:  Scheduled:  . enoxaparin (LOVENOX) injection  40 mg Subcutaneous Q24H  . piperacillin-tazobactam (ZOSYN)  IV  3.375 g Intravenous Q8H  . vancomycin  1,000 mg Intravenous Q12H   Infusions:  . sodium chloride    . sodium chloride 125 mL/hr at 10/07/14 0119   PRN: ondansetron **OR** ondansetron (ZOFRAN) IV   Assessment: [age]yr male with elevated CBC & SCr, dehydration, NSAID and opiate abuse, encephalopathy, R/O sepsis.  Coverage with vancomycin and Zosyn  Goal of Therapy:  Desire vancomycin trough 15-3620mcg/ml.  Will use standard Zosyn regimen with CrCl >6120ml/min   Plan:  1.  Vancomycin 1gm and Zosyn 3.375gm dose already given approx 12MN 2.  Start vancomycin 1gm IV q12h @ 12noon 3.   Start Zosyn 3.375gm IV q8h (4hr infusions) at 0500 4.  Monitor for indices of infection and renal function 5.  Measure steady state vancomycin serum trough level as clinically indicated  Patricio Popwell, Lloyd HugerNeil E 10/07/2014,1:37 AM

## 2014-10-07 NOTE — H&P (Signed)
PCP:   Hoyle Sauer, MD   Chief Complaint:  ams  HPI: 39 yo male h/o cbp, substance abuse was found unresponsive by his family tonight.  They called ems, he was bluish, they gave him narcan and he woke up a little.  Pt is more alert now.  He states he ran out of his morphine 60 mg tablets 3 days ago and was not able to get refill til today.  The last 3 days he has been taking a lot of otc ibu and tylenol which has not helped.  He has had no major withdrawal, no fevers.  No cough.  No illnessess just dealing with his lower back pain, no n/v/d.  When he got his refill today he decided to take 3 extra of the  pills.  He denies he was trying to kill himself.  He just says the pain was so bad, he took extra of his pills.  Review of Systems:  Positive and negative as per HPI otherwise all other systems are negative  Past Medical History: Past Medical History  Diagnosis Date  . Back pain, chronic   . Anxiety   . Depression   . Tobacco abuse   . Substance abuse    History reviewed. No pertinent past surgical history.  Medications: Prior to Admission medications   Medication Sig Start Date End Date Taking? Authorizing Provider  busPIRone (BUSPAR) 10 MG tablet Take 1 tablet by mouth 3 (three) times daily.  11/06/13   Historical Provider, MD  clonazePAM (KLONOPIN) 1 MG tablet Take 1 mg by mouth 3 (three) times daily.    Historical Provider, MD  cyclobenzaprine (FLEXERIL) 10 MG tablet Take 2 tablets (20 mg total) by mouth 3 (three) times daily as needed for muscle spasms. 11/08/13   Erick Blinks, MD  HYDROcodone-acetaminophen (NORCO) 10-325 MG per tablet Take 1-2 tablets by mouth 3 (three) times daily as needed for moderate pain or severe pain.     Historical Provider, MD  morphine (MS CONTIN) 60 MG 12 hr tablet Take 60 mg by mouth every 12 (twelve) hours.    Historical Provider, MD  risperiDONE (RISPERDAL) 2 MG tablet Take 1-2 mg by mouth 2 (two) times daily. 1/2 tablet in the morning  and 1 tablet at bedtime.    Historical Provider, MD  traZODone (DESYREL) 100 MG tablet Take 150 mg by mouth daily.     Historical Provider, MD  VIIBRYD 40 MG TABS Take 1 tablet by mouth 2 (two) times daily. 10/22/13   Historical Provider, MD    Allergies:  No Known Allergies  Social History:  reports that he has been smoking.  He does not have any smokeless tobacco history on file. He reports that he drinks alcohol. He reports that he uses illicit drugs.  Family History: Reviewed, negative  Physical Exam: Filed Vitals:   10/06/14 2300 10/07/14 0006 10/07/14 0011 10/07/14 0030  BP: 91/63  Pulse: 112 113 114 111  Temp:      TempSrc:      Resp: SpO2: 99% 95% 95% 97%   General appearance: alert, cooperative, no distress and slowed mentation Head: Normocephalic, without obvious abnormality, atraumatic Eyes: negative Nose: Nares normal. Septum midline. Mucosa normal. No drainage or sinus tenderness. Neck: no JVD and supple, symmetrical, trachea midline Lungs: clear to auscultation bilaterally Heart: regular rate and rhythm, S1, S2 normal, no murmur, click, rub or gallop Abdomen: soft, non-tender; bowel sounds normal; no masses,  no organomegaly Extremities: extremities normal, atraumatic, no cyanosis or edema Pulses: 2+ and symmetric Skin: Skin color, texture, turgor normal. No rashes or lesions Neurologic: Grossly normal   Labs on Admission:   Recent Labs  10/06/14 2200  NA 147*  K 4.9  CL 112  CO2 27  GLUCOSE 93  BUN 21  CREATININE 2.02*  CALCIUM 8.3*    Recent Labs  10/06/14 2200  AST 76*  ALT 80*  ALKPHOS 105  BILITOT 0.4  PROT 7.2  ALBUMIN 4.2    Recent Labs  10/06/14 2200  WBC 29.0*  NEUTROABS 25.6*  HGB 12.9*  HCT 41.2  MCV 94.7  PLT 309   Radiological Exams on Admission: Dg Chest Portable 1 View  10/06/2014   CLINICAL DATA:  Drug overdose tonight. SOB and abnormal WBC currently. AMS. Pt unable to follow  breathing instructions. HX smoker, substance abuse.  EXAM: PORTABLE CHEST - 1 VIEW  COMPARISON:  11/07/2013  FINDINGS: There are low lung volumes. Hazy lung base opacity is noted which may all be atelectasis. Infiltrate is possible. Mild basilar pulmonary edema is possible.  Cardiac silhouette is normal in size. No mediastinal or hilar masses.  Bony thorax is unremarkable.  IMPRESSION: Hazy lung base opacity accentuated by low lung volumes. Findings may reflect bibasilar pneumonia or atelectasis or combination or possibly pulmonary edema.   Electronically Signed   By: Amie Portlandavid  Ormond M.D.   On: 10/06/2014 23:17    Assessment/Plan  39 yo male with encephalopathy due to unintentional drug overdose but cannot r/o underlying sepsis  Principal Problem:   Acute encephalopathy-  Suspect this is due to his overdose.  But cannot r/o bacteremia, cover with vanc/zosyn.  Blood cx done.  No rashes on body, ua and cxr are negative.  Hold all sedatives.  Place in stepdown overnight.  Active Problems:  Stable unless o/w noted   Back pain, chronic-  Hold narcotics at this time   Anxiety   Substance abuse   AKI (acute kidney injury)-  Ivf,  Stop nsaids.   Overdose   Leukocytosis-  May be stress related, but considering sepsis/bacteremia  Pt is aware of the danger of him misusing his narcotics.  i have had a thorough discussion with him about this and that he almost killed himself tonight.  Have also advised him that this behavior of misusing his medications would only make it more difficult for his doctors to continue to prescribe these for him.  He understands.  Admit to stepdown.  Full code.  Ashtin Rosner A 10/07/2014, 12:38 AM

## 2014-10-07 NOTE — Care Management Utilization Note (Signed)
UR completed 

## 2014-10-07 NOTE — ED Notes (Signed)
Pt. Alert and talking. Pt. States that he was not attempting to harm himself. Pt. Reports taking the medication to help with his back pain. Pt. Denies suicidal ideation.

## 2014-10-08 DIAGNOSIS — T402X1A Poisoning by other opioids, accidental (unintentional), initial encounter: Secondary | ICD-10-CM | POA: Diagnosis not present

## 2014-10-08 LAB — CBC WITH DIFFERENTIAL/PLATELET
Basophils Absolute: 0 10*3/uL (ref 0.0–0.1)
Basophils Relative: 0 % (ref 0–1)
EOS PCT: 1 % (ref 0–5)
Eosinophils Absolute: 0.1 10*3/uL (ref 0.0–0.7)
HCT: 33.9 % — ABNORMAL LOW (ref 39.0–52.0)
HEMOGLOBIN: 11.1 g/dL — AB (ref 13.0–17.0)
Lymphocytes Relative: 9 % — ABNORMAL LOW (ref 12–46)
Lymphs Abs: 1.4 10*3/uL (ref 0.7–4.0)
MCH: 30.2 pg (ref 26.0–34.0)
MCHC: 32.7 g/dL (ref 30.0–36.0)
MCV: 92.1 fL (ref 78.0–100.0)
MONO ABS: 1.1 10*3/uL — AB (ref 0.1–1.0)
MONOS PCT: 8 % (ref 3–12)
Neutro Abs: 12 10*3/uL — ABNORMAL HIGH (ref 1.7–7.7)
Neutrophils Relative %: 82 % — ABNORMAL HIGH (ref 43–77)
Platelets: 198 10*3/uL (ref 150–400)
RBC: 3.68 MIL/uL — ABNORMAL LOW (ref 4.22–5.81)
RDW: 14 % (ref 11.5–15.5)
WBC: 14.5 10*3/uL — ABNORMAL HIGH (ref 4.0–10.5)

## 2014-10-08 LAB — COMPREHENSIVE METABOLIC PANEL
ALBUMIN: 3.1 g/dL — AB (ref 3.5–5.2)
ALT: 47 U/L (ref 0–53)
AST: 32 U/L (ref 0–37)
Alkaline Phosphatase: 82 U/L (ref 39–117)
Anion gap: 4 — ABNORMAL LOW (ref 5–15)
BUN: 11 mg/dL (ref 6–23)
CO2: 30 mmol/L (ref 19–32)
Calcium: 8.2 mg/dL — ABNORMAL LOW (ref 8.4–10.5)
Chloride: 108 mmol/L (ref 96–112)
Creatinine, Ser: 1.18 mg/dL (ref 0.50–1.35)
GFR calc non Af Amer: 77 mL/min — ABNORMAL LOW (ref >90)
GFR, EST AFRICAN AMERICAN: 90 mL/min — AB (ref >90)
Glucose, Bld: 102 mg/dL — ABNORMAL HIGH (ref 70–99)
POTASSIUM: 3.2 mmol/L — AB (ref 3.5–5.1)
Sodium: 142 mmol/L (ref 135–145)
Total Bilirubin: 0.8 mg/dL (ref 0.3–1.2)
Total Protein: 5.8 g/dL — ABNORMAL LOW (ref 6.0–8.3)

## 2014-10-08 MED ORDER — AMOXICILLIN-POT CLAVULANATE 875-125 MG PO TABS
1.0000 | ORAL_TABLET | Freq: Two times a day (BID) | ORAL | Status: DC
Start: 2014-10-08 — End: 2022-12-17

## 2014-10-08 MED ORDER — PNEUMOCOCCAL VAC POLYVALENT 25 MCG/0.5ML IJ INJ
0.5000 mL | INJECTION | Freq: Once | INTRAMUSCULAR | Status: AC
  Administered 2014-10-08: 0.5 mL via INTRAMUSCULAR
  Filled 2014-10-08: qty 0.5

## 2014-10-08 MED ORDER — PNEUMOCOCCAL VAC POLYVALENT 25 MCG/0.5ML IJ INJ
0.5000 mL | INJECTION | INTRAMUSCULAR | Status: DC
  Filled 2014-10-08: qty 0.5

## 2014-10-08 MED ORDER — INFLUENZA VAC SPLIT QUAD 0.5 ML IM SUSY
0.5000 mL | PREFILLED_SYRINGE | Freq: Once | INTRAMUSCULAR | Status: AC
  Administered 2014-10-08: 0.5 mL via INTRAMUSCULAR
  Filled 2014-10-08: qty 0.5

## 2014-10-08 MED ORDER — POTASSIUM CHLORIDE CRYS ER 20 MEQ PO TBCR
40.0000 meq | EXTENDED_RELEASE_TABLET | Freq: Once | ORAL | Status: AC
  Administered 2014-10-08: 40 meq via ORAL
  Filled 2014-10-08: qty 2

## 2014-10-08 MED ORDER — INFLUENZA VAC SPLIT QUAD 0.5 ML IM SUSY
0.5000 mL | PREFILLED_SYRINGE | INTRAMUSCULAR | Status: DC
  Filled 2014-10-08: qty 0.5

## 2014-10-08 NOTE — Discharge Summary (Signed)
Physician Discharge Summary  Richard Benitez ZOX:[id number] DOB: 09-Jul-1976 DOA: 10/06/2014  PCP: Hoyle Sauer, MD  Admit date: 10/06/2014 Discharge date: 10/08/2014  Time spent: 30 minutes  Recommendations for Outpatient Follow-up:  1. Follow up with PCP in 1-2 weeks 2. Would consider reducing or stopping controlled/sedating meds as tolerated  Discharge Diagnoses:  Principal Problem:   Acute encephalopathy Active Problems:   Back pain, chronic   Anxiety   Substance abuse   AKI (acute kidney injury)   Overdose   Leukocytosis   Discharge Condition: Improved  Diet recommendation: Regular  Filed Weights   10/07/14 0119 10/08/14 0454  Weight: 96.2 kg (212 lb 1.3 oz) 97.7 kg (215 lb 6.2 oz)    History of present illness:  Please see admit h and p from 1/22 for details. Briefly, pt presents with unresponsiveness after unintentionally overdosing on narcotics in the setting of chronic pain. The patient was admitted for further work up.  Hospital Course:  1. Suspected unintentional opiate overdose 1. Presenting AMS most likely secondary to opiate overdose 2. Improved and later resolved with narcan 3. Pt still in pain, carefully resumed home dosed pain meds 4. Would recommend either decreasing or stopping narcotics/sedatives as tolerated. Will defer med changes to PCP. 2. Suspected Aspiration Pneumonia with Leukocytosis 1. Remained afebrile 2. CXR with findings suggestive of PNA 3. Patient was placed on empiric vanc and zosyn 4. Complete course with PO augmentin on d/c 5. Suspect aspiration from unresponsive state from above 6. O2 sats on ambulation around floor noted to be 91% on RA 3. Chronic back pain 1. Treat with analgesics cautiously 2. Per above, recommend decreasing narcotics/finding alternative pain remedies, defer to PCP  4. ARF 1. Improved 2. Cont to encourage hydration 5. Hyperkalemia 1. Will give kayexalate  6. DVTpropylaxis 1. Lovenox  subq  Consultations:  none  Discharge Exam: Filed Vitals:   10/08/14 0600 10/08/14 0700 10/08/14 0800 10/08/14 0900  BP: 144/85 154/86 145/85 160/93  Pulse: 94 97 95 98  Temp:      TempSrc:      Resp: Height:      Weight:      SpO2: 92% 95% 95% 95%    General: Awake, in nad Cardiovascular: regular, s1, s2 Respiratory: normal resp effort, no wheezing  Discharge Instructions     Medication List    TAKE these medications        amoxicillin-clavulanate 875-125 MG per tablet  Commonly known as:  AUGMENTIN  Take 1 tablet by mouth 2 (two) times daily.     busPIRone 15 MG tablet  Commonly known as:  BUSPAR  Take 15 mg by mouth 3 (three) times daily.     clonazePAM 1 MG tablet  Commonly known as:  KLONOPIN  Take 1 mg by mouth 3 (three) times daily.     cyclobenzaprine 10 MG tablet  Commonly known as:  FLEXERIL  Take 2 tablets (20 mg total) by mouth 3 (three) times daily as needed for muscle spasms.     HYDROcodone-acetaminophen 10-325 MG per tablet  Commonly known as:  NORCO  Take 1-2 tablets by mouth 3 (three) times daily as needed for moderate pain or severe pain.     hydrOXYzine 25 MG capsule  Commonly known as:  VISTARIL  Take 25 mg by mouth every 4 (four) hours as needed. anxiety     morphine 60 MG 12 hr tablet  Commonly known as:  MS CONTIN  Take 60  mg by mouth every 12 (twelve) hours.     QUEtiapine 50 MG tablet  Commonly known as:  SEROQUEL  Take 100 mg by mouth at bedtime.     risperiDONE 2 MG tablet  Commonly known as:  RISPERDAL  Take 1-2 mg by mouth 2 (two) times daily. 1/2 tablet in the morning and 1 tablet at bedtime.     traZODone 100 MG tablet  Commonly known as:  DESYREL  Take 150 mg by mouth at bedtime.     VIIBRYD 40 MG Tabs  Generic drug:  Vilazodone HCl  Take 1 tablet by mouth 2 (two) times daily.       No Known Allergies Follow-up Information    Follow up with Hoyle SauerAVVA,RAVISANKAR R, MD. Schedule an appointment as  soon as possible for a visit in 1 week.   Specialty:  Internal Medicine   Contact information:   [address] [place] [address] [phone]        The results of significant diagnostics from this hospitalization (including imaging, microbiology, ancillary and laboratory) are listed below for reference.    Significant Diagnostic Studies: Dg Chest Portable 1 View  10/06/2014   CLINICAL DATA:  Drug overdose tonight. SOB and abnormal WBC currently. AMS. Pt unable to follow breathing instructions. HX smoker, substance abuse.  EXAM: PORTABLE CHEST - 1 VIEW  COMPARISON:  11/07/2013  FINDINGS: There are low lung volumes. Hazy lung base opacity is noted which may all be atelectasis. Infiltrate is possible. Mild basilar pulmonary edema is possible.  Cardiac silhouette is normal in size. No mediastinal or hilar masses.  Bony thorax is unremarkable.  IMPRESSION: Hazy lung base opacity accentuated by low lung volumes. Findings may reflect bibasilar pneumonia or atelectasis or combination or possibly pulmonary edema.   Electronically Signed   By: Amie Portlandavid  Ormond M.D.   On: 10/06/2014 23:17    Microbiology: Recent Results (from the past 240 hour(s))  Blood culture (routine x 2)     Status: None (Preliminary result)   Collection Time: 10/06/14 11:10 PM  Result Value Ref Range Status   Specimen Description BLOOD LEFT ARM  Final   Special Requests BOTTLES DRAWN AEROBIC AND ANAEROBIC 8CC EACH  Final   Culture NO GROWTH 2 DAYS  Final   Report Status PENDING  Incomplete  Blood culture (routine x 2)     Status: None (Preliminary result)   Collection Time: 10/06/14 11:15 PM  Result Value Ref Range Status   Specimen Description BLOOD RIGHT ANTECUBITAL  Final   Special Requests BOTTLES DRAWN AEROBIC AND ANAEROBIC 8CC EACH  Final   Culture NO GROWTH 2 DAYS  Final   Report Status PENDING  Incomplete  MRSA PCR Screening     Status: None   Collection Time: 10/07/14  1:05 AM  Result Value Ref Range  Status   MRSA by PCR NEGATIVE NEGATIVE Final    Comment:        The GeneXpert MRSA Assay (FDA approved for NASAL specimens only), is one component of a comprehensive MRSA colonization surveillance program. It is not intended to diagnose MRSA infection nor to guide or monitor treatment for MRSA infections.      Labs: Basic Metabolic Panel:  Recent Labs Lab 10/06/14 2200 10/07/14 0457 10/08/14 0444  NA 147* 142 142  K 4.9 6.2* 3.2*  CL 112 111 108  CO2 27 24 30   GLUCOSE 93 82 102*  BUN 21 24* 11  CREATININE 2.02* 1.82* 1.18  CALCIUM 8.3* 7.6*  8.2*   Liver Function Tests:  Recent Labs Lab 10/06/14 2200 10/07/14 0457 10/08/14 0444  AST 76* 61* 32  ALT 80* 69* 47  ALKPHOS 105 84 82  BILITOT 0.4 0.6 0.8  PROT 7.2 7.1 5.8*  ALBUMIN 4.2 4.1 3.1*   No results for input(s): LIPASE, AMYLASE in the last 168 hours. No results for input(s): AMMONIA in the last 168 hours. CBC:  Recent Labs Lab 10/06/14 2200 10/07/14 0457 10/08/14 0444  WBC 29.0* 29.2* 14.5*  NEUTROABS 25.6*  --  12.0*  HGB 12.9* 12.4* 11.1*  HCT 41.2 39.3 33.9*  MCV 94.7 95.2 92.1  PLT 309 312 198   Cardiac Enzymes:  Recent Labs Lab 10/07/14 0453  CKTOTAL 147   BNP: BNP (last 3 results) No results for input(s): PROBNP in the last 8760 hours. CBG: No results for input(s): GLUCAP in the last 168 hours.  Signed:  Annaliah Rivenbark K  [facility] 10/08/2014, 10:00 AM

## 2014-10-08 NOTE — Progress Notes (Signed)
D/c instructions reviewed with patient and mother. Verbalized understanding.  Pt dc'd to home with mother. Schonewitz, Candelaria StagersLeigh Anne 10/08/2014

## 2014-10-11 LAB — CULTURE, BLOOD (ROUTINE X 2)
Culture: NO GROWTH
Culture: NO GROWTH

## 2014-10-17 DIAGNOSIS — F419 Anxiety disorder, unspecified: Secondary | ICD-10-CM | POA: Diagnosis not present

## 2014-10-17 DIAGNOSIS — F3181 Bipolar II disorder: Secondary | ICD-10-CM | POA: Diagnosis not present

## 2014-12-02 DIAGNOSIS — M5489 Other dorsalgia: Secondary | ICD-10-CM | POA: Diagnosis not present

## 2014-12-02 DIAGNOSIS — Z79899 Other long term (current) drug therapy: Secondary | ICD-10-CM | POA: Diagnosis not present

## 2014-12-02 DIAGNOSIS — Z6825 Body mass index (BMI) 25.0-25.9, adult: Secondary | ICD-10-CM | POA: Diagnosis not present

## 2014-12-02 DIAGNOSIS — Z720 Tobacco use: Secondary | ICD-10-CM | POA: Diagnosis not present

## 2014-12-30 DIAGNOSIS — Z720 Tobacco use: Secondary | ICD-10-CM | POA: Diagnosis not present

## 2014-12-30 DIAGNOSIS — M5489 Other dorsalgia: Secondary | ICD-10-CM | POA: Diagnosis not present

## 2014-12-30 DIAGNOSIS — Z6825 Body mass index (BMI) 25.0-25.9, adult: Secondary | ICD-10-CM | POA: Diagnosis not present

## 2014-12-30 DIAGNOSIS — Z79899 Other long term (current) drug therapy: Secondary | ICD-10-CM | POA: Diagnosis not present

## 2015-01-16 DIAGNOSIS — F419 Anxiety disorder, unspecified: Secondary | ICD-10-CM | POA: Diagnosis not present

## 2015-01-16 DIAGNOSIS — F3181 Bipolar II disorder: Secondary | ICD-10-CM | POA: Diagnosis not present

## 2015-01-17 DIAGNOSIS — F419 Anxiety disorder, unspecified: Secondary | ICD-10-CM | POA: Diagnosis not present

## 2015-01-17 DIAGNOSIS — F3181 Bipolar II disorder: Secondary | ICD-10-CM | POA: Diagnosis not present

## 2015-01-17 DIAGNOSIS — Z79899 Other long term (current) drug therapy: Secondary | ICD-10-CM | POA: Diagnosis not present

## 2015-01-27 DIAGNOSIS — Z6826 Body mass index (BMI) 26.0-26.9, adult: Secondary | ICD-10-CM | POA: Diagnosis not present

## 2015-01-27 DIAGNOSIS — Z720 Tobacco use: Secondary | ICD-10-CM | POA: Diagnosis not present

## 2015-01-27 DIAGNOSIS — Z79899 Other long term (current) drug therapy: Secondary | ICD-10-CM | POA: Diagnosis not present

## 2015-01-27 DIAGNOSIS — M5489 Other dorsalgia: Secondary | ICD-10-CM | POA: Diagnosis not present

## 2015-02-24 DIAGNOSIS — Z6827 Body mass index (BMI) 27.0-27.9, adult: Secondary | ICD-10-CM | POA: Diagnosis not present

## 2015-02-24 DIAGNOSIS — Z79899 Other long term (current) drug therapy: Secondary | ICD-10-CM | POA: Diagnosis not present

## 2015-02-24 DIAGNOSIS — M5489 Other dorsalgia: Secondary | ICD-10-CM | POA: Diagnosis not present

## 2015-02-24 DIAGNOSIS — Z720 Tobacco use: Secondary | ICD-10-CM | POA: Diagnosis not present

## 2015-03-24 IMAGING — CR DG CHEST 1V PORT
1 series · 1 of 1 positions shown · non-contrast
Comparison: 11/07/2013

CLINICAL DATA: Drug overdose tonight. SOB and abnormal WBC
currently. AMS. Pt unable to follow breathing instructions. HX
smoker, substance abuse.

EXAM:
PORTABLE CHEST - 1 VIEW

[portable]
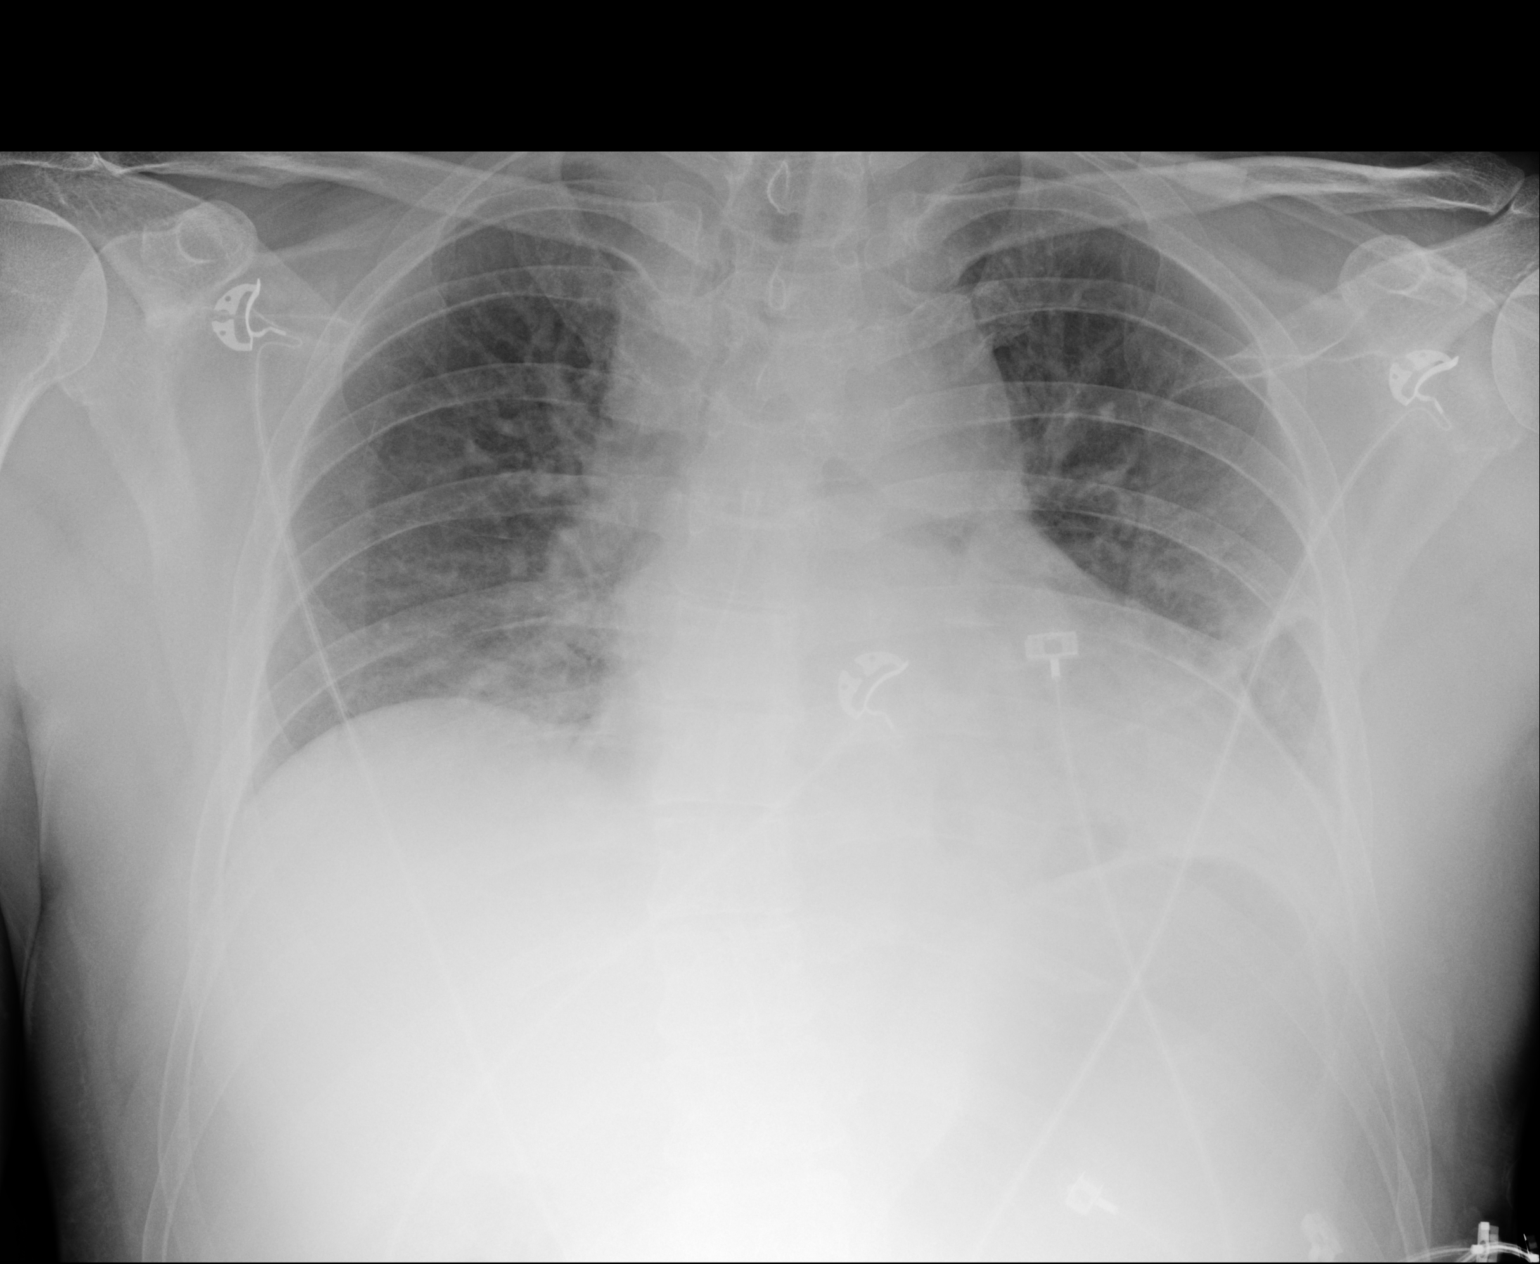

[1 of 1 positions shown; findings below may reference images not displayed]

FINDINGS: There are low lung volumes. Hazy lung base opacity is noted which
may all be atelectasis. Infiltrate is possible. Mild basilar
pulmonary edema is possible.

Cardiac silhouette is normal in size. No mediastinal or hilar
masses.

Bony thorax is unremarkable.
IMPRESSION: Hazy lung base opacity accentuated by low lung volumes. Findings may
reflect bibasilar pneumonia or atelectasis or combination or
possibly pulmonary edema.

## 2015-04-05 DIAGNOSIS — M5489 Other dorsalgia: Secondary | ICD-10-CM | POA: Diagnosis not present

## 2015-04-05 DIAGNOSIS — Z79899 Other long term (current) drug therapy: Secondary | ICD-10-CM | POA: Diagnosis not present

## 2015-04-05 DIAGNOSIS — Z6827 Body mass index (BMI) 27.0-27.9, adult: Secondary | ICD-10-CM | POA: Diagnosis not present

## 2015-04-05 DIAGNOSIS — Z1389 Encounter for screening for other disorder: Secondary | ICD-10-CM | POA: Diagnosis not present

## 2015-04-05 DIAGNOSIS — Z720 Tobacco use: Secondary | ICD-10-CM | POA: Diagnosis not present

## 2015-04-13 DIAGNOSIS — F419 Anxiety disorder, unspecified: Secondary | ICD-10-CM | POA: Diagnosis not present

## 2015-04-13 DIAGNOSIS — F3181 Bipolar II disorder: Secondary | ICD-10-CM | POA: Diagnosis not present

## 2015-05-01 DIAGNOSIS — M5489 Other dorsalgia: Secondary | ICD-10-CM | POA: Diagnosis not present

## 2015-05-01 DIAGNOSIS — Z79899 Other long term (current) drug therapy: Secondary | ICD-10-CM | POA: Diagnosis not present

## 2015-05-01 DIAGNOSIS — Z6827 Body mass index (BMI) 27.0-27.9, adult: Secondary | ICD-10-CM | POA: Diagnosis not present

## 2015-06-05 DIAGNOSIS — M5489 Other dorsalgia: Secondary | ICD-10-CM | POA: Diagnosis not present

## 2015-06-05 DIAGNOSIS — Z79899 Other long term (current) drug therapy: Secondary | ICD-10-CM | POA: Diagnosis not present

## 2015-06-05 DIAGNOSIS — Z6828 Body mass index (BMI) 28.0-28.9, adult: Secondary | ICD-10-CM | POA: Diagnosis not present

## 2015-07-04 DIAGNOSIS — Z79899 Other long term (current) drug therapy: Secondary | ICD-10-CM | POA: Diagnosis not present

## 2015-07-04 DIAGNOSIS — M5489 Other dorsalgia: Secondary | ICD-10-CM | POA: Diagnosis not present

## 2015-07-13 DIAGNOSIS — F419 Anxiety disorder, unspecified: Secondary | ICD-10-CM | POA: Diagnosis not present

## 2015-07-13 DIAGNOSIS — F3181 Bipolar II disorder: Secondary | ICD-10-CM | POA: Diagnosis not present

## 2015-08-01 DIAGNOSIS — M5489 Other dorsalgia: Secondary | ICD-10-CM | POA: Diagnosis not present

## 2015-08-01 DIAGNOSIS — Z720 Tobacco use: Secondary | ICD-10-CM | POA: Diagnosis not present

## 2015-08-01 DIAGNOSIS — Z79899 Other long term (current) drug therapy: Secondary | ICD-10-CM | POA: Diagnosis not present

## 2015-08-01 DIAGNOSIS — Z23 Encounter for immunization: Secondary | ICD-10-CM | POA: Diagnosis not present

## 2015-08-01 DIAGNOSIS — Z6828 Body mass index (BMI) 28.0-28.9, adult: Secondary | ICD-10-CM | POA: Diagnosis not present

## 2015-08-01 DIAGNOSIS — F329 Major depressive disorder, single episode, unspecified: Secondary | ICD-10-CM | POA: Diagnosis not present

## 2015-09-01 DIAGNOSIS — B851 Pediculosis due to Pediculus humanus corporis: Secondary | ICD-10-CM | POA: Diagnosis not present

## 2015-09-01 DIAGNOSIS — Z79899 Other long term (current) drug therapy: Secondary | ICD-10-CM | POA: Diagnosis not present

## 2015-09-01 DIAGNOSIS — Z6827 Body mass index (BMI) 27.0-27.9, adult: Secondary | ICD-10-CM | POA: Diagnosis not present

## 2015-09-01 DIAGNOSIS — M5489 Other dorsalgia: Secondary | ICD-10-CM | POA: Diagnosis not present

## 2015-10-02 DIAGNOSIS — F3181 Bipolar II disorder: Secondary | ICD-10-CM | POA: Diagnosis not present

## 2015-10-02 DIAGNOSIS — Z6826 Body mass index (BMI) 26.0-26.9, adult: Secondary | ICD-10-CM | POA: Diagnosis not present

## 2015-10-02 DIAGNOSIS — M5489 Other dorsalgia: Secondary | ICD-10-CM | POA: Diagnosis not present

## 2015-10-02 DIAGNOSIS — Z79899 Other long term (current) drug therapy: Secondary | ICD-10-CM | POA: Diagnosis not present

## 2015-10-02 DIAGNOSIS — J069 Acute upper respiratory infection, unspecified: Secondary | ICD-10-CM | POA: Diagnosis not present

## 2015-10-02 DIAGNOSIS — Z720 Tobacco use: Secondary | ICD-10-CM | POA: Diagnosis not present

## 2015-10-11 DIAGNOSIS — F3181 Bipolar II disorder: Secondary | ICD-10-CM | POA: Diagnosis not present

## 2015-10-11 DIAGNOSIS — F419 Anxiety disorder, unspecified: Secondary | ICD-10-CM | POA: Diagnosis not present

## 2015-11-02 DIAGNOSIS — F329 Major depressive disorder, single episode, unspecified: Secondary | ICD-10-CM | POA: Diagnosis not present

## 2015-11-02 DIAGNOSIS — M5489 Other dorsalgia: Secondary | ICD-10-CM | POA: Diagnosis not present

## 2015-11-02 DIAGNOSIS — R945 Abnormal results of liver function studies: Secondary | ICD-10-CM | POA: Diagnosis not present

## 2015-11-02 DIAGNOSIS — J069 Acute upper respiratory infection, unspecified: Secondary | ICD-10-CM | POA: Diagnosis not present

## 2015-11-02 DIAGNOSIS — Z6826 Body mass index (BMI) 26.0-26.9, adult: Secondary | ICD-10-CM | POA: Diagnosis not present

## 2015-11-02 DIAGNOSIS — Z720 Tobacco use: Secondary | ICD-10-CM | POA: Diagnosis not present

## 2015-11-07 DIAGNOSIS — F3181 Bipolar II disorder: Secondary | ICD-10-CM | POA: Diagnosis not present

## 2015-11-07 DIAGNOSIS — F419 Anxiety disorder, unspecified: Secondary | ICD-10-CM | POA: Diagnosis not present

## 2015-11-30 DIAGNOSIS — M5489 Other dorsalgia: Secondary | ICD-10-CM | POA: Diagnosis not present

## 2015-11-30 DIAGNOSIS — R945 Abnormal results of liver function studies: Secondary | ICD-10-CM | POA: Diagnosis not present

## 2015-11-30 DIAGNOSIS — Z79899 Other long term (current) drug therapy: Secondary | ICD-10-CM | POA: Diagnosis not present

## 2015-11-30 DIAGNOSIS — Z6826 Body mass index (BMI) 26.0-26.9, adult: Secondary | ICD-10-CM | POA: Diagnosis not present

## 2015-11-30 DIAGNOSIS — Z720 Tobacco use: Secondary | ICD-10-CM | POA: Diagnosis not present

## 2016-01-01 DIAGNOSIS — Z79899 Other long term (current) drug therapy: Secondary | ICD-10-CM | POA: Diagnosis not present

## 2016-01-01 DIAGNOSIS — Z6825 Body mass index (BMI) 25.0-25.9, adult: Secondary | ICD-10-CM | POA: Diagnosis not present

## 2016-01-01 DIAGNOSIS — M5489 Other dorsalgia: Secondary | ICD-10-CM | POA: Diagnosis not present

## 2016-01-16 DIAGNOSIS — F3181 Bipolar II disorder: Secondary | ICD-10-CM | POA: Diagnosis not present

## 2016-01-16 DIAGNOSIS — F419 Anxiety disorder, unspecified: Secondary | ICD-10-CM | POA: Diagnosis not present

## 2016-02-01 DIAGNOSIS — M5489 Other dorsalgia: Secondary | ICD-10-CM | POA: Diagnosis not present

## 2016-02-01 DIAGNOSIS — Z79899 Other long term (current) drug therapy: Secondary | ICD-10-CM | POA: Diagnosis not present

## 2016-02-01 DIAGNOSIS — R030 Elevated blood-pressure reading, without diagnosis of hypertension: Secondary | ICD-10-CM | POA: Diagnosis not present

## 2016-02-01 DIAGNOSIS — L308 Other specified dermatitis: Secondary | ICD-10-CM | POA: Diagnosis not present

## 2016-02-01 DIAGNOSIS — Z6825 Body mass index (BMI) 25.0-25.9, adult: Secondary | ICD-10-CM | POA: Diagnosis not present

## 2016-02-01 DIAGNOSIS — Z720 Tobacco use: Secondary | ICD-10-CM | POA: Diagnosis not present

## 2016-02-01 DIAGNOSIS — R945 Abnormal results of liver function studies: Secondary | ICD-10-CM | POA: Diagnosis not present

## 2016-04-02 DIAGNOSIS — Z6823 Body mass index (BMI) 23.0-23.9, adult: Secondary | ICD-10-CM | POA: Diagnosis not present

## 2016-04-02 DIAGNOSIS — M5489 Other dorsalgia: Secondary | ICD-10-CM | POA: Diagnosis not present

## 2016-04-02 DIAGNOSIS — Z79899 Other long term (current) drug therapy: Secondary | ICD-10-CM | POA: Diagnosis not present

## 2016-04-04 DIAGNOSIS — F419 Anxiety disorder, unspecified: Secondary | ICD-10-CM | POA: Diagnosis not present

## 2016-04-04 DIAGNOSIS — F3181 Bipolar II disorder: Secondary | ICD-10-CM | POA: Diagnosis not present

## 2016-05-16 DIAGNOSIS — F3181 Bipolar II disorder: Secondary | ICD-10-CM | POA: Diagnosis not present

## 2016-05-16 DIAGNOSIS — F419 Anxiety disorder, unspecified: Secondary | ICD-10-CM | POA: Diagnosis not present

## 2016-07-03 DIAGNOSIS — L309 Dermatitis, unspecified: Secondary | ICD-10-CM | POA: Diagnosis not present

## 2016-07-03 DIAGNOSIS — M5489 Other dorsalgia: Secondary | ICD-10-CM | POA: Diagnosis not present

## 2016-07-03 DIAGNOSIS — F329 Major depressive disorder, single episode, unspecified: Secondary | ICD-10-CM | POA: Diagnosis not present

## 2016-07-03 DIAGNOSIS — Z79899 Other long term (current) drug therapy: Secondary | ICD-10-CM | POA: Diagnosis not present

## 2016-07-03 DIAGNOSIS — Z720 Tobacco use: Secondary | ICD-10-CM | POA: Diagnosis not present

## 2016-07-15 DIAGNOSIS — F3181 Bipolar II disorder: Secondary | ICD-10-CM | POA: Diagnosis not present

## 2016-07-15 DIAGNOSIS — F419 Anxiety disorder, unspecified: Secondary | ICD-10-CM | POA: Diagnosis not present

## 2016-08-30 DIAGNOSIS — Z79899 Other long term (current) drug therapy: Secondary | ICD-10-CM | POA: Diagnosis not present

## 2016-08-30 DIAGNOSIS — Z6823 Body mass index (BMI) 23.0-23.9, adult: Secondary | ICD-10-CM | POA: Diagnosis not present

## 2016-08-30 DIAGNOSIS — R509 Fever, unspecified: Secondary | ICD-10-CM | POA: Diagnosis not present

## 2016-08-30 DIAGNOSIS — M5489 Other dorsalgia: Secondary | ICD-10-CM | POA: Diagnosis not present

## 2016-10-14 DIAGNOSIS — F3181 Bipolar II disorder: Secondary | ICD-10-CM | POA: Diagnosis not present

## 2016-10-14 DIAGNOSIS — F419 Anxiety disorder, unspecified: Secondary | ICD-10-CM | POA: Diagnosis not present

## 2016-10-31 DIAGNOSIS — Z6823 Body mass index (BMI) 23.0-23.9, adult: Secondary | ICD-10-CM | POA: Diagnosis not present

## 2016-10-31 DIAGNOSIS — Z79899 Other long term (current) drug therapy: Secondary | ICD-10-CM | POA: Diagnosis not present

## 2016-10-31 DIAGNOSIS — M5489 Other dorsalgia: Secondary | ICD-10-CM | POA: Diagnosis not present

## 2017-01-01 DIAGNOSIS — Z79899 Other long term (current) drug therapy: Secondary | ICD-10-CM | POA: Diagnosis not present

## 2017-01-01 DIAGNOSIS — Z6824 Body mass index (BMI) 24.0-24.9, adult: Secondary | ICD-10-CM | POA: Diagnosis not present

## 2017-01-01 DIAGNOSIS — R945 Abnormal results of liver function studies: Secondary | ICD-10-CM | POA: Diagnosis not present

## 2017-01-01 DIAGNOSIS — Z1389 Encounter for screening for other disorder: Secondary | ICD-10-CM | POA: Diagnosis not present

## 2017-01-01 DIAGNOSIS — L308 Other specified dermatitis: Secondary | ICD-10-CM | POA: Diagnosis not present

## 2017-01-01 DIAGNOSIS — M5489 Other dorsalgia: Secondary | ICD-10-CM | POA: Diagnosis not present

## 2017-01-01 DIAGNOSIS — R030 Elevated blood-pressure reading, without diagnosis of hypertension: Secondary | ICD-10-CM | POA: Diagnosis not present

## 2017-02-04 DIAGNOSIS — F251 Schizoaffective disorder, depressive type: Secondary | ICD-10-CM | POA: Diagnosis not present

## 2017-02-04 DIAGNOSIS — F419 Anxiety disorder, unspecified: Secondary | ICD-10-CM | POA: Diagnosis not present

## 2017-02-25 DIAGNOSIS — F419 Anxiety disorder, unspecified: Secondary | ICD-10-CM | POA: Diagnosis not present

## 2017-02-25 DIAGNOSIS — F251 Schizoaffective disorder, depressive type: Secondary | ICD-10-CM | POA: Diagnosis not present

## 2017-04-01 DIAGNOSIS — Z6841 Body Mass Index (BMI) 40.0 and over, adult: Secondary | ICD-10-CM | POA: Diagnosis not present

## 2017-04-01 DIAGNOSIS — Z79899 Other long term (current) drug therapy: Secondary | ICD-10-CM | POA: Diagnosis not present

## 2017-04-01 DIAGNOSIS — M5489 Other dorsalgia: Secondary | ICD-10-CM | POA: Diagnosis not present

## 2017-04-01 DIAGNOSIS — F329 Major depressive disorder, single episode, unspecified: Secondary | ICD-10-CM | POA: Diagnosis not present

## 2017-04-08 DIAGNOSIS — F251 Schizoaffective disorder, depressive type: Secondary | ICD-10-CM | POA: Diagnosis not present

## 2017-04-08 DIAGNOSIS — F419 Anxiety disorder, unspecified: Secondary | ICD-10-CM | POA: Diagnosis not present

## 2017-07-02 DIAGNOSIS — Z6828 Body mass index (BMI) 28.0-28.9, adult: Secondary | ICD-10-CM | POA: Diagnosis not present

## 2017-07-02 DIAGNOSIS — F3289 Other specified depressive episodes: Secondary | ICD-10-CM | POA: Diagnosis not present

## 2017-07-02 DIAGNOSIS — M5489 Other dorsalgia: Secondary | ICD-10-CM | POA: Diagnosis not present

## 2017-07-02 DIAGNOSIS — L308 Other specified dermatitis: Secondary | ICD-10-CM | POA: Diagnosis not present

## 2017-07-02 DIAGNOSIS — Z79899 Other long term (current) drug therapy: Secondary | ICD-10-CM | POA: Diagnosis not present

## 2017-07-07 DIAGNOSIS — Z79899 Other long term (current) drug therapy: Secondary | ICD-10-CM | POA: Diagnosis not present

## 2017-07-07 DIAGNOSIS — F419 Anxiety disorder, unspecified: Secondary | ICD-10-CM | POA: Diagnosis not present

## 2017-07-07 DIAGNOSIS — F251 Schizoaffective disorder, depressive type: Secondary | ICD-10-CM | POA: Diagnosis not present

## 2017-08-04 DIAGNOSIS — F419 Anxiety disorder, unspecified: Secondary | ICD-10-CM | POA: Diagnosis not present

## 2017-08-04 DIAGNOSIS — F251 Schizoaffective disorder, depressive type: Secondary | ICD-10-CM | POA: Diagnosis not present

## 2017-09-30 DIAGNOSIS — Z79899 Other long term (current) drug therapy: Secondary | ICD-10-CM | POA: Diagnosis not present

## 2017-09-30 DIAGNOSIS — M5489 Other dorsalgia: Secondary | ICD-10-CM | POA: Diagnosis not present

## 2017-11-03 DIAGNOSIS — F419 Anxiety disorder, unspecified: Secondary | ICD-10-CM | POA: Diagnosis not present

## 2017-11-03 DIAGNOSIS — F251 Schizoaffective disorder, depressive type: Secondary | ICD-10-CM | POA: Diagnosis not present

## 2017-12-26 DIAGNOSIS — Z79899 Other long term (current) drug therapy: Secondary | ICD-10-CM | POA: Diagnosis not present

## 2017-12-26 DIAGNOSIS — Z1389 Encounter for screening for other disorder: Secondary | ICD-10-CM | POA: Diagnosis not present

## 2017-12-26 DIAGNOSIS — M5489 Other dorsalgia: Secondary | ICD-10-CM | POA: Diagnosis not present

## 2017-12-26 DIAGNOSIS — F3289 Other specified depressive episodes: Secondary | ICD-10-CM | POA: Diagnosis not present

## 2017-12-26 DIAGNOSIS — Z6828 Body mass index (BMI) 28.0-28.9, adult: Secondary | ICD-10-CM | POA: Diagnosis not present

## 2017-12-26 DIAGNOSIS — L309 Dermatitis, unspecified: Secondary | ICD-10-CM | POA: Diagnosis not present

## 2018-01-29 DIAGNOSIS — F251 Schizoaffective disorder, depressive type: Secondary | ICD-10-CM | POA: Diagnosis not present

## 2018-02-02 DIAGNOSIS — F251 Schizoaffective disorder, depressive type: Secondary | ICD-10-CM | POA: Diagnosis not present

## 2018-02-02 DIAGNOSIS — F419 Anxiety disorder, unspecified: Secondary | ICD-10-CM | POA: Diagnosis not present

## 2018-03-26 DIAGNOSIS — Z6826 Body mass index (BMI) 26.0-26.9, adult: Secondary | ICD-10-CM | POA: Diagnosis not present

## 2018-03-26 DIAGNOSIS — M5489 Other dorsalgia: Secondary | ICD-10-CM | POA: Diagnosis not present

## 2018-03-26 DIAGNOSIS — Z79899 Other long term (current) drug therapy: Secondary | ICD-10-CM | POA: Diagnosis not present

## 2018-05-04 DIAGNOSIS — F251 Schizoaffective disorder, depressive type: Secondary | ICD-10-CM | POA: Diagnosis not present

## 2018-05-04 DIAGNOSIS — F419 Anxiety disorder, unspecified: Secondary | ICD-10-CM | POA: Diagnosis not present

## 2018-07-01 DIAGNOSIS — E7849 Other hyperlipidemia: Secondary | ICD-10-CM | POA: Diagnosis not present

## 2018-07-01 DIAGNOSIS — Z720 Tobacco use: Secondary | ICD-10-CM | POA: Diagnosis not present

## 2018-07-01 DIAGNOSIS — F3289 Other specified depressive episodes: Secondary | ICD-10-CM | POA: Diagnosis not present

## 2018-07-01 DIAGNOSIS — Z79899 Other long term (current) drug therapy: Secondary | ICD-10-CM | POA: Diagnosis not present

## 2018-07-01 DIAGNOSIS — M5489 Other dorsalgia: Secondary | ICD-10-CM | POA: Diagnosis not present

## 2018-07-01 DIAGNOSIS — R945 Abnormal results of liver function studies: Secondary | ICD-10-CM | POA: Diagnosis not present

## 2018-07-01 DIAGNOSIS — Z6825 Body mass index (BMI) 25.0-25.9, adult: Secondary | ICD-10-CM | POA: Diagnosis not present

## 2018-08-03 DIAGNOSIS — F419 Anxiety disorder, unspecified: Secondary | ICD-10-CM | POA: Diagnosis not present

## 2018-08-03 DIAGNOSIS — F251 Schizoaffective disorder, depressive type: Secondary | ICD-10-CM | POA: Diagnosis not present

## 2018-09-29 DIAGNOSIS — Z6826 Body mass index (BMI) 26.0-26.9, adult: Secondary | ICD-10-CM | POA: Diagnosis not present

## 2018-09-29 DIAGNOSIS — M5489 Other dorsalgia: Secondary | ICD-10-CM | POA: Diagnosis not present

## 2018-09-29 DIAGNOSIS — Z79899 Other long term (current) drug therapy: Secondary | ICD-10-CM | POA: Diagnosis not present

## 2018-11-30 DIAGNOSIS — F251 Schizoaffective disorder, depressive type: Secondary | ICD-10-CM | POA: Diagnosis not present

## 2018-11-30 DIAGNOSIS — F419 Anxiety disorder, unspecified: Secondary | ICD-10-CM | POA: Diagnosis not present

## 2018-12-02 DIAGNOSIS — Z79891 Long term (current) use of opiate analgesic: Secondary | ICD-10-CM | POA: Diagnosis not present

## 2018-12-29 DIAGNOSIS — F329 Major depressive disorder, single episode, unspecified: Secondary | ICD-10-CM | POA: Diagnosis not present

## 2018-12-29 DIAGNOSIS — M5489 Other dorsalgia: Secondary | ICD-10-CM | POA: Diagnosis not present

## 2018-12-29 DIAGNOSIS — E785 Hyperlipidemia, unspecified: Secondary | ICD-10-CM | POA: Diagnosis not present

## 2018-12-29 DIAGNOSIS — K649 Unspecified hemorrhoids: Secondary | ICD-10-CM | POA: Diagnosis not present

## 2018-12-29 DIAGNOSIS — Z720 Tobacco use: Secondary | ICD-10-CM | POA: Diagnosis not present

## 2018-12-29 DIAGNOSIS — Z79899 Other long term (current) drug therapy: Secondary | ICD-10-CM | POA: Diagnosis not present

## 2018-12-29 DIAGNOSIS — L309 Dermatitis, unspecified: Secondary | ICD-10-CM | POA: Diagnosis not present

## 2018-12-29 DIAGNOSIS — Z1331 Encounter for screening for depression: Secondary | ICD-10-CM | POA: Diagnosis not present

## 2019-02-18 DIAGNOSIS — R030 Elevated blood-pressure reading, without diagnosis of hypertension: Secondary | ICD-10-CM | POA: Diagnosis not present

## 2019-02-18 DIAGNOSIS — R29898 Other symptoms and signs involving the musculoskeletal system: Secondary | ICD-10-CM | POA: Diagnosis not present

## 2019-02-23 DIAGNOSIS — S6991XA Unspecified injury of right wrist, hand and finger(s), initial encounter: Secondary | ICD-10-CM | POA: Diagnosis not present

## 2019-02-23 DIAGNOSIS — R030 Elevated blood-pressure reading, without diagnosis of hypertension: Secondary | ICD-10-CM | POA: Diagnosis not present

## 2019-02-23 DIAGNOSIS — G5631 Lesion of radial nerve, right upper limb: Secondary | ICD-10-CM | POA: Diagnosis not present

## 2019-02-23 DIAGNOSIS — R29898 Other symptoms and signs involving the musculoskeletal system: Secondary | ICD-10-CM | POA: Diagnosis not present

## 2019-02-23 DIAGNOSIS — M7989 Other specified soft tissue disorders: Secondary | ICD-10-CM | POA: Diagnosis not present

## 2019-03-02 DIAGNOSIS — G5631 Lesion of radial nerve, right upper limb: Secondary | ICD-10-CM | POA: Diagnosis not present

## 2019-03-30 DIAGNOSIS — M5489 Other dorsalgia: Secondary | ICD-10-CM | POA: Diagnosis not present

## 2019-03-30 DIAGNOSIS — L309 Dermatitis, unspecified: Secondary | ICD-10-CM | POA: Diagnosis not present

## 2019-03-30 DIAGNOSIS — F329 Major depressive disorder, single episode, unspecified: Secondary | ICD-10-CM | POA: Diagnosis not present

## 2019-03-30 DIAGNOSIS — Z79899 Other long term (current) drug therapy: Secondary | ICD-10-CM | POA: Diagnosis not present

## 2019-03-30 DIAGNOSIS — K649 Unspecified hemorrhoids: Secondary | ICD-10-CM | POA: Diagnosis not present

## 2019-05-13 DIAGNOSIS — F251 Schizoaffective disorder, depressive type: Secondary | ICD-10-CM | POA: Diagnosis not present

## 2019-05-13 DIAGNOSIS — F419 Anxiety disorder, unspecified: Secondary | ICD-10-CM | POA: Diagnosis not present

## 2019-06-25 DIAGNOSIS — E785 Hyperlipidemia, unspecified: Secondary | ICD-10-CM | POA: Diagnosis not present

## 2019-06-25 DIAGNOSIS — Z720 Tobacco use: Secondary | ICD-10-CM | POA: Diagnosis not present

## 2019-06-25 DIAGNOSIS — Z79899 Other long term (current) drug therapy: Secondary | ICD-10-CM | POA: Diagnosis not present

## 2019-06-25 DIAGNOSIS — M5489 Other dorsalgia: Secondary | ICD-10-CM | POA: Diagnosis not present

## 2019-06-25 DIAGNOSIS — L309 Dermatitis, unspecified: Secondary | ICD-10-CM | POA: Diagnosis not present

## 2019-06-25 DIAGNOSIS — F329 Major depressive disorder, single episode, unspecified: Secondary | ICD-10-CM | POA: Diagnosis not present

## 2019-07-16 ENCOUNTER — Encounter (HOSPITAL_COMMUNITY): Payer: Self-pay

## 2019-07-16 ENCOUNTER — Emergency Department (HOSPITAL_COMMUNITY)
Admission: EM | Admit: 2019-07-16 | Discharge: 2019-07-20 | Disposition: A | Discharge status: Other Acute Inpatient Hospital | Payer: Medicare Other | Attending: Emergency Medicine | Admitting: Emergency Medicine

## 2019-07-16 ENCOUNTER — Other Ambulatory Visit: Payer: Self-pay

## 2019-07-16 DIAGNOSIS — Z20828 Contact with and (suspected) exposure to other viral communicable diseases: Secondary | ICD-10-CM | POA: Insufficient documentation

## 2019-07-16 DIAGNOSIS — F17210 Nicotine dependence, cigarettes, uncomplicated: Secondary | ICD-10-CM | POA: Diagnosis not present

## 2019-07-16 DIAGNOSIS — Z046 Encounter for general psychiatric examination, requested by authority: Secondary | ICD-10-CM | POA: Diagnosis not present

## 2019-07-16 DIAGNOSIS — Z79899 Other long term (current) drug therapy: Secondary | ICD-10-CM | POA: Diagnosis not present

## 2019-07-16 DIAGNOSIS — R462 Strange and inexplicable behavior: Secondary | ICD-10-CM

## 2019-07-16 DIAGNOSIS — F250 Schizoaffective disorder, bipolar type: Secondary | ICD-10-CM | POA: Insufficient documentation

## 2019-07-16 DIAGNOSIS — F22 Delusional disorders: Secondary | ICD-10-CM | POA: Diagnosis not present

## 2019-07-16 DIAGNOSIS — Z03818 Encounter for observation for suspected exposure to other biological agents ruled out: Secondary | ICD-10-CM | POA: Diagnosis not present

## 2019-07-16 DIAGNOSIS — R440 Auditory hallucinations: Secondary | ICD-10-CM | POA: Diagnosis present

## 2019-07-16 DIAGNOSIS — R000 Tachycardia, unspecified: Secondary | ICD-10-CM | POA: Diagnosis not present

## 2019-07-16 HISTORY — DX: Schizoaffective disorder, unspecified: F25.9

## 2019-07-16 LAB — CBC WITH DIFFERENTIAL/PLATELET
Abs Immature Granulocytes: 0.02 10*3/uL (ref 0.00–0.07)
Basophils Absolute: 0.1 10*3/uL (ref 0.0–0.1)
Basophils Relative: 1 %
Eosinophils Absolute: 0 10*3/uL (ref 0.0–0.5)
Eosinophils Relative: 0 %
HCT: 46.4 % (ref 39.0–52.0)
Hemoglobin: 14.6 g/dL (ref 13.0–17.0)
Immature Granulocytes: 0 %
Lymphocytes Relative: 19 %
Lymphs Abs: 1.7 10*3/uL (ref 0.7–4.0)
MCH: 29.3 pg (ref 26.0–34.0)
MCHC: 31.5 g/dL (ref 30.0–36.0)
MCV: 93 fL (ref 80.0–100.0)
Monocytes Absolute: 0.8 10*3/uL (ref 0.1–1.0)
Monocytes Relative: 9 %
Neutro Abs: 6.2 10*3/uL (ref 1.7–7.7)
Neutrophils Relative %: 71 %
Platelets: 341 10*3/uL (ref 150–400)
RBC: 4.99 MIL/uL (ref 4.22–5.81)
RDW: 13.9 % (ref 11.5–15.5)
WBC: 8.7 10*3/uL (ref 4.0–10.5)
nRBC: 0 % (ref 0.0–0.2)

## 2019-07-16 LAB — COMPREHENSIVE METABOLIC PANEL
ALT: 19 U/L (ref 0–44)
AST: 19 U/L (ref 15–41)
Albumin: 4.3 g/dL (ref 3.5–5.0)
Alkaline Phosphatase: 124 U/L (ref 38–126)
Anion gap: 10 (ref 5–15)
BUN: 9 mg/dL (ref 6–20)
CO2: 26 mmol/L (ref 22–32)
Calcium: 9.4 mg/dL (ref 8.9–10.3)
Chloride: 102 mmol/L (ref 98–111)
Creatinine, Ser: 0.99 mg/dL (ref 0.61–1.24)
GFR calc Af Amer: >60 mL/min (ref >60)
GFR calc non Af Amer: >60 mL/min (ref >60)
Glucose, Bld: 116 mg/dL — ABNORMAL HIGH (ref 70–99)
Potassium: 3.3 mmol/L — ABNORMAL LOW (ref 3.5–5.1)
Sodium: 138 mmol/L (ref 135–145)
Total Bilirubin: 0.5 mg/dL (ref 0.3–1.2)
Total Protein: 8.1 g/dL (ref 6.5–8.1)

## 2019-07-16 LAB — URINALYSIS, ROUTINE W REFLEX MICROSCOPIC
Bacteria, UA: NONE SEEN
Bilirubin Urine: NEGATIVE
Glucose, UA: NEGATIVE mg/dL
Hgb urine dipstick: NEGATIVE
Ketones, ur: NEGATIVE mg/dL
Nitrite: NEGATIVE
Protein, ur: NEGATIVE mg/dL
Specific Gravity, Urine: 1.004 — ABNORMAL LOW (ref 1.005–1.030)
pH: 6 (ref 5.0–8.0)

## 2019-07-16 LAB — RAPID URINE DRUG SCREEN, HOSP PERFORMED
Amphetamines: NOT DETECTED
Barbiturates: NOT DETECTED
Benzodiazepines: NOT DETECTED
Cocaine: NOT DETECTED
Opiates: POSITIVE — AB
Tetrahydrocannabinol: POSITIVE — AB

## 2019-07-16 LAB — SALICYLATE LEVEL: Salicylate Lvl: <7 mg/dL (ref 2.8–30.0)

## 2019-07-16 LAB — HIV ANTIBODY (ROUTINE TESTING W REFLEX): HIV Screen 4th Generation wRfx: NONREACTIVE

## 2019-07-16 LAB — ACETAMINOPHEN LEVEL: Acetaminophen (Tylenol), Serum: 12 ug/mL (ref 10–30)

## 2019-07-16 LAB — AMMONIA: Ammonia: 17 umol/L (ref 9–35)

## 2019-07-16 MED ORDER — BUSPIRONE HCL 5 MG PO TABS
15.0000 mg | ORAL_TABLET | Freq: Three times a day (TID) | ORAL | Status: DC
  Administered 2019-07-16 – 2019-07-20 (×10): 15 mg via ORAL
  Filled 2019-07-16 (×10): qty 3

## 2019-07-16 MED ORDER — QUETIAPINE FUMARATE 100 MG PO TABS
100.0000 mg | ORAL_TABLET | Freq: Every day | ORAL | Status: DC
  Administered 2019-07-16 – 2019-07-19 (×4): 100 mg via ORAL
  Filled 2019-07-16 (×5): qty 1

## 2019-07-16 MED ORDER — CLONAZEPAM 0.5 MG PO TABS
1.0000 mg | ORAL_TABLET | Freq: Three times a day (TID) | ORAL | Status: DC
  Administered 2019-07-16 – 2019-07-20 (×11): 1 mg via ORAL
  Filled 2019-07-16 (×11): qty 2

## 2019-07-16 MED ORDER — POTASSIUM CHLORIDE CRYS ER 20 MEQ PO TBCR
40.0000 meq | EXTENDED_RELEASE_TABLET | Freq: Once | ORAL | Status: AC
  Administered 2019-07-16: 40 meq via ORAL
  Filled 2019-07-16: qty 2

## 2019-07-16 MED ORDER — NICOTINE 14 MG/24HR TD PT24
14.0000 mg | MEDICATED_PATCH | Freq: Once | TRANSDERMAL | Status: AC
  Administered 2019-07-16 – 2019-07-17 (×2): 14 mg via TRANSDERMAL
  Filled 2019-07-16: qty 1

## 2019-07-16 MED ORDER — LORAZEPAM 2 MG/ML IJ SOLN
1.0000 mg | Freq: Once | INTRAMUSCULAR | Status: AC
  Administered 2019-07-16: 1 mg via INTRAVENOUS
  Filled 2019-07-16: qty 1

## 2019-07-16 MED ORDER — HYDROXYZINE PAMOATE 25 MG PO CAPS
25.0000 mg | ORAL_CAPSULE | ORAL | Status: DC | PRN
  Administered 2019-07-19: 20:00:00 25 mg via ORAL
  Filled 2019-07-16: qty 1

## 2019-07-16 MED ORDER — SODIUM CHLORIDE 0.9 % IV BOLUS
1000.0000 mL | Freq: Once | INTRAVENOUS | Status: AC
  Administered 2019-07-16: 1000 mL via INTRAVENOUS

## 2019-07-16 MED ORDER — LORAZEPAM 2 MG/ML IJ SOLN
0.5000 mg | Freq: Once | INTRAMUSCULAR | Status: AC
  Administered 2019-07-16: 0.5 mg via INTRAVENOUS
  Filled 2019-07-16: qty 1

## 2019-07-16 MED ORDER — TRAZODONE HCL 50 MG PO TABS
150.0000 mg | ORAL_TABLET | Freq: Every day | ORAL | Status: DC
  Administered 2019-07-17 – 2019-07-19 (×3): 150 mg via ORAL
  Filled 2019-07-16 (×3): qty 3

## 2019-07-16 MED ORDER — VILAZODONE HCL 40 MG PO TABS
40.0000 mg | ORAL_TABLET | Freq: Two times a day (BID) | ORAL | Status: DC
  Filled 2019-07-16 (×5): qty 1

## 2019-07-16 MED ORDER — MORPHINE SULFATE ER 30 MG PO TBCR
60.0000 mg | EXTENDED_RELEASE_TABLET | Freq: Two times a day (BID) | ORAL | Status: DC
  Administered 2019-07-16 – 2019-07-20 (×8): 60 mg via ORAL
  Filled 2019-07-16 (×8): qty 2

## 2019-07-16 MED ORDER — CYCLOBENZAPRINE HCL 10 MG PO TABS
20.0000 mg | ORAL_TABLET | Freq: Three times a day (TID) | ORAL | Status: DC | PRN
  Administered 2019-07-16 – 2019-07-19 (×7): 20 mg via ORAL
  Filled 2019-07-16 (×7): qty 2

## 2019-07-16 MED ORDER — RISPERIDONE 1 MG PO TABS
1.0000 mg | ORAL_TABLET | Freq: Two times a day (BID) | ORAL | Status: DC
  Administered 2019-07-16 – 2019-07-17 (×2): 1 mg via ORAL
  Administered 2019-07-17: 2 mg via ORAL
  Administered 2019-07-18 – 2019-07-20 (×5): 1 mg via ORAL
  Filled 2019-07-16 (×7): qty 1
  Filled 2019-07-16: qty 2

## 2019-07-16 NOTE — BH Assessment (Signed)
Tele Assessment Note   Patient Name: Richard Benitez MRN: [id number] Referring Physician: Luevenia Maxin Location of Patient: [facility] Location of Provider: Behavioral Health TTS Department  Per EDP Report:  43 y.o. male with history of anxiety, chronic back pain, polysubstance abuse, tobacco abuse, schizoaffective disorder presents for evaluation of possible bat bites and bizarre behavior. Patient states  that there have been to bats in his home. He lives with his parents upstairs and father states that they have had bats intermittently over the past several years.  Pt had noted what looked like bat droppings to him on his pillowcase and an exterminator also noted this but they have not captured any bats.  Pt thinks that he was bitten by a bat along the dorsum of the left hand 3 days ago and he noted several lesions to his bilateral upper extremities around 4 days ago. He denies recreational drug use and denies alcohol intake.  When asked about what his primary concern is today he states "I do not know, things just are not adding up.  I think someone is poisoning my water ".  He also told his parents that at some point last week he fell but cannot remember how and thinks someone stripped him of his clothing.  The patient tells me that he is hearing voices which she has experienced intermittently for the last several years "but never this bad ".  Denies homicidal or suicidal ideations, no visual hallucinations.  He states that he hears a voice saying "I am under your bed, I am going to kill you".  Father states that the patient told him that there are microphones planted around the house listening to them and he was concerned that someone was going to come kill them.  Father is concerned that the patient is confused.  He does note that while he and his wife were out of town the patient ran out of his Klonopin last Wednesday but they did have this refilled on Tuesday.  TTS: Patient presents to ED with his father.  He  has not slept in two days and has been off his Abilify for 2-3 months.  Patient has been increasingly psychotic and delusional.  He thinks that there are bats in the house biting him, he thinks that there are speakers in the walls talking to him as well as microphones in the house.  Last night at 3 am he was outside with a flashlight thinking there were people in the woods coming to get him. He states that he has also heard voices of people saying that they are going to kill him.  Patient states that he has been hearing and seeing things that others do not hear or see for the past two weeks.  He states that he was prescribed Abilify because he states that he was feeling things touching him in the middle of the night.  He states that he stopped taking the Abilify as well as his Vistaril because he states that he did not like the way it made him feel.  Patient states that he has never been inpatient for his psych issues, but states that he was hospitalized 9-10 years ago for detox from alcohol.  Father, who is present with patient states that he overdosed and almost died in the past because he has a history of polysubstance dependence.  Father states that he and patient's moth dispense his medications.  Patient states that he only smoke marijuana on occasion currently and denies any other  drug use.  Patient also denies any history of being suicidal/homicidal.  Patient states that he has not been eating well and states that he has lost thirty pounds in the past four months.  He states that he has not slept in two days.  He denies any history of abuse or self-mutilation.  Patient is alert, but psychotic.  He appears to be oriented, his memory intact and his thoughts fairly organized, but he is delusional.  While he is not an eminent harm to himself currently, his behavior is getting more bizarre and he is becoming increasingly psychotic.  He is at risk of not being able to distinguish reality from his delusions and he  is placing himself in dangerous situations.  He has fallen twice this week while psychotic.  His judgment, insight and impulse control are impaired.  He was resistant signing into the hospital voluntarily because he stated that he would be taken off his Klonopin and Morphine.  Therefore, an IVC had to be initiated.    Diagnosis: F25.0 Schizoaffective Disorder Bipolar Type  Past Medical History:  Past Medical History:  Diagnosis Date  . Anxiety   . Back pain, chronic   . Depression   . Schizoaffective disorder (HCC)   . Substance abuse (HCC)   . Tobacco abuse     History reviewed. No pertinent surgical history.  Family History: No family history on file.  Social History:  reports that he has been smoking. He has been smoking about 0.50 packs per day. He has never used smokeless tobacco. He reports previous alcohol use. He reports current drug use. Drug: Marijuana.  Additional Social History:  Alcohol / Drug Use Pain Medications: see MAR Prescriptions: see MAR Over the Counter: see MAR History of alcohol / drug use?: Yes Longest period of sobriety (when/how long): unable to determine Substance #1 Name of Substance 1: cannabis 1 - Age of First Use: unknown 1 - Amount (size/oz): "a small amount for sleep 1 - Frequency: states that he only smokes on occasion 1 - Duration: unknown 1 - Last Use / Amount: 3 weeks ago  CIWA: CIWA-Ar BP: (!) 150/100 Pulse Rate: (!) 124 COWS:    Allergies: No Known Allergies  Home Medications: (Not in a hospital admission)   OB/GYN Status:  No LMP for male patient.  General Assessment Data Assessment unable to be completed: Yes Reason for not completing assessment: waiting for drug screen results Location of Assessment: [facility] TTS Assessment: In system Is this a Tele or Face-to-Face Assessment?: Tele Assessment Is this an Initial Assessment or a Re-assessment for this encounter?: Initial Assessment Patient Accompanied by::  Parent Language Other than English: No Living Arrangements: Other (Comment)(lives with parants) What gender do you identify as?: Male Marital status: Single Living Arrangements: Parent Can pt return to current living arrangement?: Yes Admission Status: Involuntary Petitioner: ED Attending Is patient capable of signing voluntary admission?: No(unwilling to sign) Referral Source: Self/Family/Friend Insurance type: Medicaid     Crisis Care Plan Living Arrangements: Parent Legal Guardian: Other:(self) Name of Psychiatrist: [facility] Name of Therapist: [facility]  Education Status Is patient currently in school?: No Is the patient employed, unemployed or receiving disability?: Receiving disability income  Risk to self with the past 6 months Suicidal Ideation: No Has patient been a risk to self within the past 6 months prior to admission? : No Suicidal Intent: No Has patient had any suicidal intent within the past 6 months prior to admission? : No  Is patient at risk for suicide?: No Suicidal Plan?: No Has patient had any suicidal plan within the past 6 months prior to admission? : No Access to Means: No What has been your use of drugs/alcohol within the last 12 months?: marijuana use on occasion Previous Attempts/Gestures: No How many times?: 0 Other Self Harm Risks: psychosis Triggers for Past Attempts: None known Intentional Self Injurious Behavior: None Family Suicide History: No Recent stressful life event(s): (sister died in 2022-12-31) Persecutory voices/beliefs?: Yes Depression: Yes Depression Symptoms: Despondent, Insomnia, Isolating Substance abuse history and/or treatment for substance abuse?: Yes Suicide prevention information given to non-admitted patients: Not applicable  Risk to Others within the past 6 months Homicidal Ideation: No Does patient have any lifetime risk of violence toward others beyond the six months prior to  admission? : No Thoughts of Harm to Others: No Current Homicidal Intent: No Current Homicidal Plan: No Access to Homicidal Means: No Identified Victim: none History of harm to others?: No Assessment of Violence: None Noted Violent Behavior Description: none Does patient have access to weapons?: No Criminal Charges Pending?: No Does patient have a court date: No Is patient on probation?: No  Psychosis Hallucinations: Auditory, Visual Delusions: (paranoid delusions)  Mental Status Report Appearance/Hygiene: Disheveled Eye Contact: Good Motor Activity: Freedom of movement Speech: Argumentative Level of Consciousness: Alert Mood: Depressed Affect: Appropriate to circumstance Anxiety Level: Moderate Thought Processes: Coherent, Relevant Judgement: Impaired Orientation: Person, Place, Time, Situation Obsessive Compulsive Thoughts/Behaviors: None  Cognitive Functioning Concentration: Normal Memory: Recent Intact, Remote Intact Is patient IDD: No Insight: Poor Impulse Control: Poor Appetite: Poor Have you had any weight changes? : (wt loss 30 lbs in 4 months) Sleep: Decreased Total Hours of Sleep: 0(no sleep in two days)  ADLScreening [facility] Assessment Services) Patient's cognitive ability adequate to safely complete daily activities?: Yes Patient able to express need for assistance with ADLs?: Yes Independently performs ADLs?: Yes (appropriate for developmental age)  Prior Inpatient Therapy Prior Inpatient Therapy: No  Prior Outpatient Therapy Prior Outpatient Therapy: Yes Prior Therapy Dates: active Prior Therapy Facilty/Provider(s): [facility] Reason for Treatment: depression, anxiety and psychosis Does patient have an ACCT team?: No Does patient have Intensive In-House Services?  : No Does patient have [facility] services? : No Does patient have P4CC services?: No  ADL Screening (condition at time of admission) Patient's cognitive ability adequate to  safely complete daily activities?: Yes Is the patient deaf or have difficulty hearing?: No Does the patient have difficulty seeing, even when wearing glasses/contacts?: No Does the patient have difficulty concentrating, remembering, or making decisions?: No Patient able to express need for assistance with ADLs?: Yes Does the patient have difficulty dressing or bathing?: No Independently performs ADLs?: Yes (appropriate for developmental age) Does the patient have difficulty walking or climbing stairs?: No Weakness of Legs: None Weakness of Arms/Hands: None  Home Assistive Devices/Equipment Home Assistive Devices/Equipment: None  Therapy Consults (therapy consults require a physician order) PT Evaluation Needed: No OT Evalulation Needed: No SLP Evaluation Needed: No Abuse/Neglect Assessment (Assessment to be complete while patient is alone) Abuse/Neglect Assessment Can Be Completed: Yes Physical Abuse: Denies Verbal Abuse: Denies Sexual Abuse: Denies Exploitation of patient/patient's resources: Denies Self-Neglect: Denies Values / Beliefs Cultural Requests During Hospitalization: None Spiritual Requests During Hospitalization: None Consults Spiritual Care Consult Needed: No Social Work Consult Needed: No Regulatory affairs officer (For Healthcare) Does Patient Have a Medical Advance Directive?: No Would patient like information on creating a medical advance directive?: No - Patient declined  Nutrition Screen- [facility] Adult/[facility] Has the patient recently lost weight without trying?: Yes, 24-33 lbs. Has the patient been eating poorly because of a decreased appetite?: Yes Malnutrition Screening Tool Score: 4        Disposition: Per Malachy Chamber, NP, patient meets inpatient admission criteria Disposition Initial Assessment Completed for this Encounter: Yes  This service was provided via telemedicine using a 2-way, interactive audio and video technology.  Names of all persons  participating in this telemedicine service and their role in this encounter. Name: Richard Benitez Role: patient  Name: Jules Schick, Sr Role: patient's father  Name: Dannielle Huh Aydee Mcnew Role: TTS  Name:  Role:     Daphene Calamity 07/16/2019 6:12 PM

## 2019-07-16 NOTE — ED Provider Notes (Signed)
[facility] EMERGENCY DEPARTMENT Provider Note   CSN: [id number] Arrival date & time: 07/16/19  1406     History   Chief Complaint Chief Complaint  Patient presents with   Medical Clearance   Psychiatric Evaluation    HPI Richard Benitez is a 43 y.o. male with history of anxiety, chronic back pain, polysubstance abuse, tobacco abuse, schizoaffective disorder presents for evaluation of possible bat bites and bizarre behavior.  The patient tells me that there have been to bats in his home.  He lives with his parents upstairs and father states that they have had bats intermittently over the past several years.  The patient had noted what looked like bat droppings to him on his pillowcase and an exterminator also noted this but they have not captured any bats.  The patient thinks that he was bitten by a bat along the dorsum of the left hand 3 days ago and he noted several lesions to his bilateral upper extremities around 4 days ago.  He notes these lesions are pruritic and he has been scratching at them.  He denies recreational drug use, smokes 10 to 14 cigarettes daily.  Denies alcohol intake.  He reports that he has had decreased appetite and decreased oral intake but no hydrophobia.  When asked about what his primary concern is today he states "I do not know, things just are not adding up.  I think someone is poisoning my water ".  He also told his parents that at some point last week he fell but cannot remember how and thinks someone stripped him of his clothing.  The patient tells me that he is hearing voices which she has experienced intermittently for the last several years "but never this bad ".  Denies homicidal or suicidal ideations, no visual hallucinations.  He states that he hears a voice saying "I am under your bed, I am going to kill you".  Father states that the patient told him that there are microphones planted around the house listening to them and he was concerned that  someone was going to come kill them.  Father is concerned that the patient is confused.  He does note that while he and his wife were out of town the patient ran out of his Klonopin last Wednesday but they did have this refilled on Tuesday.  He reports he has been taking his home medications.     The history is provided by the patient.    Past Medical History:  Diagnosis Date   Anxiety    Back pain, chronic    Depression    Schizoaffective disorder (HCC)    Substance abuse (HCC)    Tobacco abuse     Patient Active Problem List   Diagnosis Date Noted   Drug overdose    SIRS (systemic inflammatory response syndrome) (HCC)    SOB (shortness of breath)    Overdose 10/06/2014   Leukocytosis 10/06/2014   Acute encephalopathy 11/07/2013   Dehydration 11/07/2013   AKI (acute kidney injury) (HCC) 11/07/2013   Hypokalemia 11/07/2013   Anemia 11/07/2013   Back pain, chronic    Anxiety    Depression    Tobacco abuse    Substance abuse (HCC)     History reviewed. No pertinent surgical history.      Home Medications    Prior to Admission medications   Medication Sig Start Date End Date Taking? Authorizing Provider  busPIRone (BUSPAR) 15 MG tablet Take 15 mg by mouth 3 (  three) times daily. 10/03/14  Yes [provider]  clonazePAM (KLONOPIN) 1 MG tablet Take 1 mg by mouth 3 (three) times daily.   Yes [provider]  cyclobenzaprine (FLEXERIL) 10 MG tablet Take 2 tablets (20 mg total) by mouth 3 (three) times daily as needed for muscle spasms. Patient taking differently: Take 10 mg by mouth 3 (three) times daily as needed for muscle spasms.  11/08/13  Yes Erick Blinks, MD  HYDROcodone-acetaminophen (NORCO) 10-325 MG per tablet Take 1-2 tablets by mouth 3 (three) times daily as needed for moderate pain or severe pain.    Yes [provider]  morphine (MS CONTIN) 60 MG 12 hr tablet Take 60 mg by mouth every 12 (twelve) hours.   Yes  [provider]  QUEtiapine (SEROQUEL) 50 MG tablet Take 100 mg by mouth at bedtime. 09/19/14  Yes [provider]  amoxicillin-clavulanate (AUGMENTIN) 875-125 MG per tablet Take 1 tablet by mouth 2 (two) times daily. 10/08/14   Jerald Kief, MD  hydrOXYzine (VISTARIL) 25 MG capsule Take 25 mg by mouth every 4 (four) hours as needed. anxiety 09/20/14   [provider]  risperiDONE (RISPERDAL) 2 MG tablet Take 1-2 mg by mouth 2 (two) times daily. 1/2 tablet in the morning and 1 tablet at bedtime.    [provider]  traZODone (DESYREL) 100 MG tablet Take 150 mg by mouth at bedtime.     [provider]  VIIBRYD 40 MG TABS Take 1 tablet by mouth 2 (two) times daily. 10/22/13   [provider]    Family History No family history on file.  Social History Social History   Tobacco Use   Smoking status: Current Every Day Smoker    Packs/day: 0.50   Smokeless tobacco: Never Used  Substance Use Topics   Alcohol use: Not Currently    Comment: former abuse   Drug use: Yes    Types: Marijuana    Comment: 3 weeks ago, hx of polysub. dependence     Allergies   Patient has no known allergies.   Review of Systems Review of Systems  Constitutional: Positive for appetite change and unexpected weight change. Negative for fever.  Respiratory: Positive for shortness of breath (chronic, unchanged).   Cardiovascular: Negative for chest pain.  Gastrointestinal: Negative for abdominal pain, nausea and vomiting.  Skin: Positive for rash and wound.  Psychiatric/Behavioral: Positive for hallucinations.  All other systems reviewed and are negative.    Physical Exam Updated Vital Signs BP (!) 142/91    Pulse (!) 109    Temp 98.1 F (36.7 C) (Oral)    Resp 18    Ht  (1.93 m)    Wt 79.4 kg    SpO2 99%    BMI 21.30 kg/m   Physical Exam Vitals signs and nursing note reviewed.  Constitutional:      General: He is not in acute distress.     Appearance: He is well-developed.  HENT:     Head: Normocephalic and atraumatic.  Eyes:     General:        Right eye: No discharge.        Left eye: No discharge.     Conjunctiva/sclera: Conjunctivae normal.     Pupils: Pupils are equal, round, and reactive to light.  Neck:     Vascular: No JVD.     Trachea: No tracheal deviation.  Cardiovascular:     Rate and Rhythm: Regular rhythm. Tachycardia present.  Pulses: Normal pulses.     Heart sounds: Normal heart sounds.  Pulmonary:     Effort: Pulmonary effort is normal.     Breath sounds: Normal breath sounds.  Abdominal:     General: Bowel sounds are normal. There is no distension.     Palpations: Abdomen is soft.     Tenderness: There is no abdominal tenderness. There is no guarding or rebound.  Skin:    General: Skin is warm and dry.     Findings: Rash present. No erythema.     Comments: See below.  Patient with multiple skin lesions to the bilateral upper extremities.  Patient actively picking at these lesions on my assessment.  No vesicles or bulla, negative Nikolsky sign  Neurological:     Mental Status: He is alert.  Psychiatric:        Attention and Perception: He perceives auditory hallucinations. He does not perceive visual hallucinations.        Mood and Affect: Mood is anxious.        Speech: Speech normal.        Behavior: Behavior is cooperative.        Thought Content: Thought content is paranoid and delusional. Thought content does not include homicidal or suicidal ideation. Thought content does not include homicidal or suicidal plan.          ED Treatments / Results  Labs (all labs ordered are listed, but only abnormal results are displayed) Labs Reviewed  COMPREHENSIVE METABOLIC PANEL - Abnormal; Notable for the following components:      Result Value   Potassium 3.3 (*)    Glucose, Bld 116 (*)    All other components within normal limits  RAPID URINE DRUG SCREEN, HOSP PERFORMED - Abnormal;  Notable for the following components:   Opiates POSITIVE (*)    Tetrahydrocannabinol POSITIVE (*)    All other components within normal limits  URINALYSIS, ROUTINE W REFLEX MICROSCOPIC - Abnormal; Notable for the following components:   Color, Urine STRAW (*)    Specific Gravity, Urine 1.004 (*)    Leukocytes,Ua TRACE (*)    All other components within normal limits  SARS CORONAVIRUS 2 (TAT 6-24 HRS)  ACETAMINOPHEN LEVEL  SALICYLATE LEVEL  CBC WITH DIFFERENTIAL/PLATELET  AMMONIA  HIV ANTIBODY (ROUTINE TESTING W REFLEX)  RPR    EKG None  Radiology No results found.  Procedures Procedures (including critical care time)  Medications Ordered in ED Medications  busPIRone (BUSPAR) tablet 15 mg (15 mg Oral Given 07/16/19 2104)  clonazePAM (KLONOPIN) tablet 1 mg (1 mg Oral Given 07/16/19 2105)  cyclobenzaprine (FLEXERIL) tablet 20 mg (20 mg Oral Given 07/16/19 2110)  hydrOXYzine (VISTARIL) capsule 25 mg (has no administration in time range)  QUEtiapine (SEROQUEL) tablet 100 mg (100 mg Oral Given 07/16/19 2017)  risperiDONE (RISPERDAL) tablet 1-2 mg (1 mg Oral Given 07/16/19 2017)  morphine (MS CONTIN) 12 hr tablet 60 mg (60 mg Oral Given 07/16/19 2105)  traZODone (DESYREL) tablet 150 mg (150 mg Oral Refused 07/16/19 2109)  Vilazodone HCl (VIIBRYD) TABS 40 mg (40 mg Oral Refused 07/16/19 2109)  nicotine (NICODERM CQ - dosed in mg/24 hours) patch 14 mg (14 mg Transdermal Patch Applied 07/16/19 2035)  sodium chloride 0.9 % bolus 1,000 mL (0 mLs Intravenous Stopped 07/16/19 1917)  LORazepam (ATIVAN) injection 0.5 mg (0.5 mg Intravenous Given 07/16/19 1824)  LORazepam (ATIVAN) injection 1 mg (1 mg Intravenous Given 07/16/19 1914)     Initial Impression / Assessment and  Plan / ED Course  I have reviewed the triage vital signs and the nursing notes.  Pertinent labs & imaging results that were available during my care of the patient were reviewed by me and considered in my medical  decision making (see chart for details).        Patient presenting to the ED brought in by father for evaluation of bizarre behavior, possible bat bite.  He has 2 puncture wounds to the dorsum of the left hand though these appear to far apart to be from a bat bite.  He is nontoxic in appearance, neurovascularly intact.  He has several skin lesions but does report "picking at" his skin.  No signs of cellulitis or abscess.  He is afebrile, persistently tachycardic and hypertensive on initial presentation with improvement on reevaluation.  He appears anxious.  He tells me he has been compliant with his home medications but told his RN that he stopped taking his Abilify and Vistaril.  His father is concerned that he may not be able to help care for him at home due to his worsening delusions and agitation.  Lab work today reviewed by me shows no leukocytosis, no anemia, no metabolic derangements, no renal insufficiency.  He is mildly hypokalemic, will replenish orally in the ED.  His UA does not suggest UTI or nephrolithiasis.  His UDS is positive for opiates which he is prescribed chronically for his low back pain, and THC though he did deny any drug use to me.  His acetaminophen, salicylate, and ammonia levels are within normal limits.  Doubt hepatic encephalopathy.  He has no evidence of rabies infection.   TTS has seen and evaluated the patient, recommended inpatient hospitalization but the patient was resistant to this so he required IVC.  I do think that he has at risk of becoming a danger to himself and others due to worsening psychosis in the setting of substance abuse and medication noncompliance.  He is medically cleared at this time.  Final Clinical Impressions(s) / ED Diagnoses   Final diagnoses:  Delusions [facility]  Bizarre behavior    ED Discharge Orders    None       Bennye Alm 07/16/19 2255    Gerhard Munch, MD 07/19/19 1245

## 2019-07-16 NOTE — ED Notes (Signed)
Pt placed in purple scrubs. Shirt, Hat, Shoes, socks, belt, bracelet and 2 necklaces secured in locker.

## 2019-07-16 NOTE — Care Management (Signed)
  Under review [facility], [facility], [facility], [facility], [facility], [facility], [facility], [facility], [facility], [facility], [facility], [facility], [facility], [facility], [facility], [facility], [facility], [facility], [facility], [facility], [facility], [facility], [facility], [facility], [facility], [facility], [facility], [facility]  

## 2019-07-16 NOTE — ED Notes (Signed)
Pt stopped taking his Abilify and vistaril

## 2019-07-16 NOTE — ED Triage Notes (Signed)
Pt brought to ED by father. Pt presents with 2 spots on his hand and says he got bit by a bat but does not remember seeing a bat bite him. Pt states he seen bat droppings on his pillowcase. Father states for the last week he has had a loss of appetite, hearing voices constantly, saying things like "someone is going to die tonight", hearing a "boy and girl discussing killing [name] in his bathroom", says microphones planted in house to listen to him, warns father saying "They are coming to kill [place]", goes outside looking with flashlight, can't sleep since last week, says he is confused. Pt lives with father and mother. Father states all of this started last week and father and mother were out of town at the time.

## 2019-07-17 DIAGNOSIS — F250 Schizoaffective disorder, bipolar type: Secondary | ICD-10-CM | POA: Diagnosis not present

## 2019-07-17 LAB — SARS CORONAVIRUS 2 (TAT 6-24 HRS): SARS Coronavirus 2: NEGATIVE

## 2019-07-17 LAB — RPR: RPR Ser Ql: NONREACTIVE

## 2019-07-17 MED ORDER — HYDROCODONE-ACETAMINOPHEN 5-325 MG PO TABS
2.0000 | ORAL_TABLET | Freq: Three times a day (TID) | ORAL | Status: DC | PRN
  Administered 2019-07-17 – 2019-07-19 (×7): 2 via ORAL
  Filled 2019-07-17 (×8): qty 2

## 2019-07-17 MED ORDER — VILAZODONE HCL 20 MG PO TABS
40.0000 mg | ORAL_TABLET | Freq: Two times a day (BID) | ORAL | Status: DC
  Filled 2019-07-17 (×5): qty 2

## 2019-07-17 NOTE — ED Notes (Signed)
Pt refused vilazodone, states it made him gain a lot of weight, edp notified

## 2019-07-17 NOTE — Progress Notes (Signed)
Patient meets criteria for inpatient treatment. CSW re-sent faxed referrals to the following facilities for review:   [facility] - reviewing [facility] - reviewing (pending) [facility] - [facility] - reviewing  [facility] - [facility] - reviewing  [facility] - reviewing  [facility] - reviewing [facility] - reviewing [facility] - reviewing    [facility] - No beds available 07/17/2019 [facility] - No beds available (unit full) 07/17/2019 [facility] - No beds available (unit full) 07/17/2019 [facility]- No beds available (unit full) 07/17/2019 [facility] - women only; mens unit full 07/17/2019 [facility] - no beds available (unit full) 07/17/2019     TSS will continue to seek bed placement.     Aylinn Rydberg, MSW, LCSW Clinical Social Worker II/Disposition [facility]  Phone: [phone] Fax: [phone] 

## 2019-07-17 NOTE — Progress Notes (Signed)
[facility] CSW received a call from admissions at [facility] requesting patient's referral to be re-faxed over for review.  CSW completed request. Richard Benitez has adult bed availability at this time.  Stephanie Acre, LCSW-A Clinical Social Worker

## 2019-07-17 NOTE — ED Notes (Signed)
Given meal tray.

## 2019-07-17 NOTE — BH Assessment (Signed)
[place] Assessment Progress Note  Patient was seen for re-assessment.  He seemed to be clearer today and stated that he slept well last pm because he was given medication.  He stated that he has continued to hear voices, but they are not command in nature.  Due to patient's recent complications with mental illness, continued inpatient is recommended.

## 2019-07-18 DIAGNOSIS — F250 Schizoaffective disorder, bipolar type: Secondary | ICD-10-CM | POA: Diagnosis not present

## 2019-07-18 NOTE — ED Notes (Signed)
TTS in progress 

## 2019-07-18 NOTE — BHH Counselor (Signed)
Reassessment- Pt denies SI/HI. Pt states he can still hear the voices but they are better than yesterday.   TTS will continue to seek placement.  Lorenza Cambridge, [facility] Triage Specialist

## 2019-07-19 DIAGNOSIS — F250 Schizoaffective disorder, bipolar type: Secondary | ICD-10-CM | POA: Diagnosis not present

## 2019-07-19 MED ORDER — POLYETHYLENE GLYCOL 3350 17 G PO PACK
17.0000 g | PACK | Freq: Every day | ORAL | Status: DC | PRN
  Administered 2019-07-19: 18:00:00 17 g via ORAL
  Filled 2019-07-19: qty 1

## 2019-07-19 MED ORDER — ONDANSETRON HCL 4 MG PO TABS
4.0000 mg | ORAL_TABLET | Freq: Four times a day (QID) | ORAL | Status: DC | PRN
  Administered 2019-07-19: 4 mg via ORAL
  Filled 2019-07-19: qty 1

## 2019-07-19 NOTE — ED Notes (Signed)
Patient given meal tray but continues to sleep at this time.

## 2019-07-19 NOTE — ED Notes (Signed)
Patient eating meal tray at this time. 

## 2019-07-19 NOTE — ED Notes (Addendum)
Patient complaining of nausea and constipation. Advised Dr Sabra Heck. Verbal orders for PRN zofran and miralax to be given to patient.

## 2019-07-19 NOTE — ED Notes (Addendum)
Patient on the phone with his mother, Richard Benitez, and gave the phone to me to speak with her. Mother states patient told her the doctor told him he could go home tomorrow. Advised mother and patient that the note is recommending inpatient treatment. Mother would like to be called when patient is transported to another facility or discharged home. Patient agreed with giving mother information. Number to call is [phone].

## 2019-07-19 NOTE — ED Notes (Signed)
Patient ambulated to bathroom with no assistance or difficulty. 

## 2019-07-19 NOTE — ED Notes (Signed)
Pt given meal tray.

## 2019-07-19 NOTE — ED Notes (Signed)
Called to bedside by pt. Pt stated that his back is hurting 7/10. meds given per prn orders

## 2019-07-19 NOTE — BH Assessment (Signed)
[place] Assessment Progress Note This Probation officer spoke with patient this date to assess current mental health status. Patient denies any H/I or S/I although reports current AH. Patient is vague in reference to content of AH stating "it sounds like they are speaking to me from the other side of the door." Patient denies voices are command and denies any VH. Patient presents as somewhat disorganized and is slow to respond to this writer's questions. Patient is denying any thoughts of self harm although could not contract for safety. Due to patient's current concerns and recent complications with mental health continued inpatient treatment is recommended. Case was staffed with Marcello Moores NP.

## 2019-07-20 ENCOUNTER — Encounter (HOSPITAL_COMMUNITY): Payer: Self-pay | Admitting: Emergency Medicine

## 2019-07-20 DIAGNOSIS — R45851 Suicidal ideations: Secondary | ICD-10-CM | POA: Diagnosis present

## 2019-07-20 DIAGNOSIS — Z046 Encounter for general psychiatric examination, requested by authority: Secondary | ICD-10-CM | POA: Diagnosis not present

## 2019-07-20 DIAGNOSIS — F250 Schizoaffective disorder, bipolar type: Secondary | ICD-10-CM | POA: Diagnosis not present

## 2019-07-20 DIAGNOSIS — Z79899 Other long term (current) drug therapy: Secondary | ICD-10-CM | POA: Diagnosis not present

## 2019-07-20 DIAGNOSIS — R339 Retention of urine, unspecified: Secondary | ICD-10-CM | POA: Diagnosis not present

## 2019-07-20 DIAGNOSIS — R45850 Homicidal ideations: Secondary | ICD-10-CM | POA: Diagnosis present

## 2019-07-20 DIAGNOSIS — R1084 Generalized abdominal pain: Secondary | ICD-10-CM | POA: Diagnosis not present

## 2019-07-20 DIAGNOSIS — F17210 Nicotine dependence, cigarettes, uncomplicated: Secondary | ICD-10-CM | POA: Diagnosis present

## 2019-07-20 DIAGNOSIS — Z203 Contact with and (suspected) exposure to rabies: Secondary | ICD-10-CM | POA: Diagnosis present

## 2019-07-20 DIAGNOSIS — Z20828 Contact with and (suspected) exposure to other viral communicable diseases: Secondary | ICD-10-CM | POA: Diagnosis present

## 2019-07-20 DIAGNOSIS — F1011 Alcohol abuse, in remission: Secondary | ICD-10-CM | POA: Diagnosis present

## 2019-07-20 DIAGNOSIS — R300 Dysuria: Secondary | ICD-10-CM | POA: Diagnosis not present

## 2019-07-20 DIAGNOSIS — F259 Schizoaffective disorder, unspecified: Secondary | ICD-10-CM | POA: Diagnosis present

## 2019-07-20 MED ORDER — NICOTINE 14 MG/24HR TD PT24
14.0000 mg | MEDICATED_PATCH | Freq: Once | TRANSDERMAL | Status: DC
  Administered 2019-07-20: 14 mg via TRANSDERMAL
  Filled 2019-07-20: qty 1

## 2019-07-20 NOTE — ED Notes (Signed)
PT taken to [facility] by [place] [employer] at this time for transport to [facility].

## 2019-07-20 NOTE — Progress Notes (Signed)
Pt accepted to [facility], main campus.   Dr. Jonelle Sports is the accepting/attending provider.    Call report to [phone]  Rebecca @ [facility] notified.     Pt is involuntary and will be transported by law enforcement.    Pt may arrive at [facility] as soon as transportation is arranged.   Audree Camel, LCSW, [place] Disposition [facility]HH/TTS [phone]

## 2019-07-20 NOTE — ED Notes (Signed)
Verified with mother regarding bats in the home.  Mother states" we have had bats in our home and we have received an appt for an exterminator to come out"   [facility] and they are aware.

## 2019-07-24 DIAGNOSIS — R300 Dysuria: Secondary | ICD-10-CM | POA: Diagnosis not present

## 2019-07-24 DIAGNOSIS — R339 Retention of urine, unspecified: Secondary | ICD-10-CM | POA: Diagnosis not present

## 2019-07-24 DIAGNOSIS — R1084 Generalized abdominal pain: Secondary | ICD-10-CM | POA: Diagnosis not present

## 2019-07-26 DIAGNOSIS — Z203 Contact with and (suspected) exposure to rabies: Secondary | ICD-10-CM | POA: Diagnosis not present

## 2019-07-30 DIAGNOSIS — Z79899 Other long term (current) drug therapy: Secondary | ICD-10-CM | POA: Diagnosis not present

## 2019-07-30 DIAGNOSIS — L988 Other specified disorders of the skin and subcutaneous tissue: Secondary | ICD-10-CM | POA: Diagnosis not present

## 2019-07-30 DIAGNOSIS — F329 Major depressive disorder, single episode, unspecified: Secondary | ICD-10-CM | POA: Diagnosis not present

## 2019-07-30 DIAGNOSIS — M5489 Other dorsalgia: Secondary | ICD-10-CM | POA: Diagnosis not present

## 2019-07-30 DIAGNOSIS — F259 Schizoaffective disorder, unspecified: Secondary | ICD-10-CM | POA: Diagnosis not present

## 2019-07-30 DIAGNOSIS — F22 Delusional disorders: Secondary | ICD-10-CM | POA: Diagnosis not present

## 2019-08-03 DIAGNOSIS — Z466 Encounter for fitting and adjustment of urinary device: Secondary | ICD-10-CM | POA: Diagnosis not present

## 2019-08-03 DIAGNOSIS — R339 Retention of urine, unspecified: Secondary | ICD-10-CM | POA: Diagnosis not present

## 2019-08-17 DIAGNOSIS — N319 Neuromuscular dysfunction of bladder, unspecified: Secondary | ICD-10-CM | POA: Diagnosis not present

## 2019-09-23 DIAGNOSIS — F22 Delusional disorders: Secondary | ICD-10-CM | POA: Diagnosis not present

## 2019-09-23 DIAGNOSIS — Z79899 Other long term (current) drug therapy: Secondary | ICD-10-CM | POA: Diagnosis not present

## 2019-09-23 DIAGNOSIS — M5489 Other dorsalgia: Secondary | ICD-10-CM | POA: Diagnosis not present

## 2019-11-04 ENCOUNTER — Ambulatory Visit (INDEPENDENT_AMBULATORY_CARE_PROVIDER_SITE_OTHER): Payer: Medicare Other | Admitting: Otolaryngology

## 2019-11-18 ENCOUNTER — Other Ambulatory Visit: Payer: Self-pay

## 2019-11-18 ENCOUNTER — Ambulatory Visit (INDEPENDENT_AMBULATORY_CARE_PROVIDER_SITE_OTHER): Payer: Medicare Other | Admitting: Otolaryngology

## 2019-11-18 VITALS — Temp 98.2°F

## 2019-11-18 DIAGNOSIS — H6123 Impacted cerumen, bilateral: Secondary | ICD-10-CM | POA: Diagnosis not present

## 2019-11-18 NOTE — Progress Notes (Signed)
HPI: Richard Benitez is a 44 y.o. male who presents for evaluation of cerumen buildup left side worse than right side.  He has partial blockage of his left ear.  No pain or drainage..  Past Medical History:  Diagnosis Date  . Anxiety   . Back pain, chronic   . Depression   . Schizoaffective disorder ([place])   . Substance abuse ([place])   . Tobacco abuse    No past surgical history on file. Social History   Socioeconomic History  . Marital status: Single    Spouse name: Not on file  . Number of children: Not on file  . Years of education: Not on file  . Highest education level: Not on file  Occupational History  . Not on file  Tobacco Use  . Smoking status: Current Every Day Smoker    Packs/day: 0.50  . Smokeless tobacco: Never Used  Substance and Sexual Activity  . Alcohol use: Not Currently    Comment: former abuse  . Drug use: Yes    Types: Marijuana    Comment: 3 weeks ago, hx of polysub. dependence  . Sexual activity: Not Currently  Other Topics Concern  . Not on file  Social History Narrative  . Not on file   Social Determinants of Health   Financial Resource Strain:   . Difficulty of Paying Living Expenses: Not on file  Food Insecurity:   . Worried About Charity fundraiser in the Last Year: Not on file  . Ran Out of Food in the Last Year: Not on file  Transportation Needs:   . Lack of Transportation (Medical): Not on file  . Lack of Transportation (Non-Medical): Not on file  Physical Activity:   . Days of Exercise per Week: Not on file  . Minutes of Exercise per Session: Not on file  Stress:   . Feeling of Stress : Not on file  Social Connections:   . Frequency of Communication with Friends and Family: Not on file  . Frequency of Social Gatherings with Friends and Family: Not on file  . Attends Religious Services: Not on file  . Active Member of Clubs or Organizations: Not on file  . Attends Archivist Meetings: Not on file  . Marital Status:  Not on file   No family history on file. No Known Allergies Prior to Admission medications   Medication Sig Start Date End Date Taking? Authorizing Provider  amoxicillin-clavulanate (AUGMENTIN) 875-125 MG per tablet Take 1 tablet by mouth 2 (two) times daily. 10/08/14  Yes Donne Hazel, MD  busPIRone (BUSPAR) 15 MG tablet Take 15 mg by mouth 3 (three) times daily. 10/03/14  Yes [provider]  clonazePAM (KLONOPIN) 1 MG tablet Take 1 mg by mouth 3 (three) times daily.   Yes [provider]  cyclobenzaprine (FLEXERIL) 10 MG tablet Take 2 tablets (20 mg total) by mouth 3 (three) times daily as needed for muscle spasms. Patient taking differently: Take 10 mg by mouth 3 (three) times daily as needed for muscle spasms.  11/08/13  Yes Kathie Dike, MD  HYDROcodone-acetaminophen (NORCO) 10-325 MG per tablet Take 1-2 tablets by mouth 3 (three) times daily as needed for moderate pain or severe pain.    Yes [provider]  morphine (MS CONTIN) 60 MG 12 hr tablet Take 60 mg by mouth every 12 (twelve) hours.   Yes [provider]     Positive ROS: Otherwise negative  All other systems have  been reviewed and were otherwise negative with the exception of those mentioned in the HPI and as above.  Physical Exam: Constitutional: Alert, well-appearing, no acute distress Ears: External ears without lesions or tenderness. Ear canals left ear canal is completely occluded with cerumen right ear canal was just partially occluded.  Left ear canal was cleaned with suction and curette right ear canal was cleaned with a curette only.  Both TMs were clear.. Nasal: External nose without lesions. Clear nasal passages Oral: Oropharynx clear. Neck: No palpable adenopathy or masses Respiratory: Breathing comfortably  Skin: No facial/neck lesions or rash noted.  Cerumen impaction removal  Date/Time: 11/18/2019 1:49 PM Performed by: Drema Halon, MD Authorized by:  Drema Halon, MD   Consent:    Consent obtained:  Verbal   Consent given by:  Patient   Risks discussed:  Pain and bleeding Procedure details:    Location:  L ear and R ear   Procedure type: curette and suction   Post-procedure details:    Inspection:  TM intact and canal normal   Hearing quality:  Improved   Patient tolerance of procedure:  Tolerated well, no immediate complications Comments:     Both TMs are clear.    Assessment: Cerumen impaction left side worse than right  Plan: This was cleaned in the office he will follow-up as needed  Narda Bonds, MD

## 2019-12-21 DIAGNOSIS — F329 Major depressive disorder, single episode, unspecified: Secondary | ICD-10-CM | POA: Diagnosis not present

## 2019-12-21 DIAGNOSIS — R310 Gross hematuria: Secondary | ICD-10-CM | POA: Diagnosis not present

## 2019-12-21 DIAGNOSIS — F259 Schizoaffective disorder, unspecified: Secondary | ICD-10-CM | POA: Diagnosis not present

## 2019-12-21 DIAGNOSIS — K5909 Other constipation: Secondary | ICD-10-CM | POA: Diagnosis not present

## 2019-12-21 DIAGNOSIS — Z1331 Encounter for screening for depression: Secondary | ICD-10-CM | POA: Diagnosis not present

## 2019-12-21 DIAGNOSIS — Z720 Tobacco use: Secondary | ICD-10-CM | POA: Diagnosis not present

## 2019-12-21 DIAGNOSIS — E785 Hyperlipidemia, unspecified: Secondary | ICD-10-CM | POA: Diagnosis not present

## 2019-12-21 DIAGNOSIS — Z79899 Other long term (current) drug therapy: Secondary | ICD-10-CM | POA: Diagnosis not present

## 2019-12-21 DIAGNOSIS — F22 Delusional disorders: Secondary | ICD-10-CM | POA: Diagnosis not present

## 2019-12-21 DIAGNOSIS — M5489 Other dorsalgia: Secondary | ICD-10-CM | POA: Diagnosis not present

## 2020-02-07 ENCOUNTER — Encounter (HOSPITAL_COMMUNITY): Payer: Self-pay

## 2020-02-07 ENCOUNTER — Other Ambulatory Visit: Payer: Self-pay

## 2020-02-07 DIAGNOSIS — Z5321 Procedure and treatment not carried out due to patient leaving prior to being seen by health care provider: Secondary | ICD-10-CM | POA: Insufficient documentation

## 2020-02-07 DIAGNOSIS — R109 Unspecified abdominal pain: Secondary | ICD-10-CM | POA: Diagnosis not present

## 2020-02-07 NOTE — ED Triage Notes (Signed)
Pt comes from home complaining of being bloated since 0100. He says usually it goes away on its own but this time it has now.

## 2020-02-08 ENCOUNTER — Emergency Department (HOSPITAL_COMMUNITY)
Admission: EM | Admit: 2020-02-08 | Discharge: 2020-02-08 | Disposition: A | Discharge status: Home / Self Care | Payer: Medicare Other | Attending: Emergency Medicine | Admitting: Emergency Medicine

## 2020-02-15 DIAGNOSIS — R140 Abdominal distension (gaseous): Secondary | ICD-10-CM | POA: Diagnosis not present

## 2020-02-15 DIAGNOSIS — K5909 Other constipation: Secondary | ICD-10-CM | POA: Diagnosis not present

## 2020-02-15 DIAGNOSIS — M5489 Other dorsalgia: Secondary | ICD-10-CM | POA: Diagnosis not present

## 2020-02-15 DIAGNOSIS — Z79899 Other long term (current) drug therapy: Secondary | ICD-10-CM | POA: Diagnosis not present

## 2020-05-25 DIAGNOSIS — Z23 Encounter for immunization: Secondary | ICD-10-CM | POA: Diagnosis not present

## 2020-06-22 DIAGNOSIS — Z23 Encounter for immunization: Secondary | ICD-10-CM | POA: Diagnosis not present

## 2020-07-21 DIAGNOSIS — E785 Hyperlipidemia, unspecified: Secondary | ICD-10-CM | POA: Diagnosis not present

## 2020-07-21 DIAGNOSIS — Z125 Encounter for screening for malignant neoplasm of prostate: Secondary | ICD-10-CM | POA: Diagnosis not present

## 2020-07-27 DIAGNOSIS — R82998 Other abnormal findings in urine: Secondary | ICD-10-CM | POA: Diagnosis not present

## 2020-07-28 ENCOUNTER — Other Ambulatory Visit: Payer: Self-pay | Admitting: Internal Medicine

## 2020-07-28 DIAGNOSIS — R140 Abdominal distension (gaseous): Secondary | ICD-10-CM

## 2020-08-21 DIAGNOSIS — Z1212 Encounter for screening for malignant neoplasm of rectum: Secondary | ICD-10-CM | POA: Diagnosis not present

## 2020-08-23 ENCOUNTER — Ambulatory Visit
Admission: RE | Admit: 2020-08-23 | Discharge: 2020-08-23 | Disposition: A | Discharge status: Home / Self Care | Payer: Medicare Other | Source: Ambulatory Visit | Attending: Internal Medicine | Admitting: Internal Medicine

## 2020-08-23 DIAGNOSIS — R140 Abdominal distension (gaseous): Secondary | ICD-10-CM

## 2020-11-21 DIAGNOSIS — I10 Essential (primary) hypertension: Secondary | ICD-10-CM | POA: Diagnosis not present

## 2020-12-08 DIAGNOSIS — R0981 Nasal congestion: Secondary | ICD-10-CM | POA: Diagnosis not present

## 2020-12-08 DIAGNOSIS — J069 Acute upper respiratory infection, unspecified: Secondary | ICD-10-CM | POA: Diagnosis not present

## 2020-12-08 DIAGNOSIS — R059 Cough, unspecified: Secondary | ICD-10-CM | POA: Diagnosis not present

## 2021-04-29 ENCOUNTER — Emergency Department (HOSPITAL_COMMUNITY)
Admission: EM | Admit: 2021-04-29 | Discharge: 2021-04-29 | Disposition: A | Discharge status: Home / Self Care | Payer: Medicare Other | Attending: Emergency Medicine | Admitting: Emergency Medicine

## 2021-04-29 ENCOUNTER — Encounter (HOSPITAL_COMMUNITY): Payer: Self-pay | Admitting: *Deleted

## 2021-04-29 DIAGNOSIS — G8929 Other chronic pain: Secondary | ICD-10-CM | POA: Insufficient documentation

## 2021-04-29 DIAGNOSIS — M549 Dorsalgia, unspecified: Secondary | ICD-10-CM | POA: Insufficient documentation

## 2021-04-29 DIAGNOSIS — F17210 Nicotine dependence, cigarettes, uncomplicated: Secondary | ICD-10-CM | POA: Diagnosis not present

## 2021-04-29 DIAGNOSIS — R339 Retention of urine, unspecified: Secondary | ICD-10-CM

## 2021-04-29 DIAGNOSIS — R350 Frequency of micturition: Secondary | ICD-10-CM | POA: Diagnosis present

## 2021-04-29 LAB — URINALYSIS, ROUTINE W REFLEX MICROSCOPIC
Bilirubin Urine: NEGATIVE
Glucose, UA: NEGATIVE mg/dL
Hgb urine dipstick: NEGATIVE
Ketones, ur: NEGATIVE mg/dL
Leukocytes,Ua: NEGATIVE
Nitrite: NEGATIVE
Protein, ur: NEGATIVE mg/dL
Specific Gravity, Urine: 1.005 (ref 1.005–1.030)
pH: 6 (ref 5.0–8.0)

## 2021-04-29 MED ORDER — TAMSULOSIN HCL 0.4 MG PO CAPS: 0.4000 mg | ORAL_CAPSULE | Freq: Every day | ORAL | 0 refills | Status: DC

## 2021-04-29 NOTE — ED Notes (Signed)
Pt provided discharge instructions and prescription information. Pt was given the opportunity to ask questions and questions were answered. Discharge signature not obtained in the setting of the COVID-19 pandemic in order to reduce high touch surfaces.  ° °

## 2021-04-29 NOTE — ED Provider Notes (Addendum)
[facility] EMERGENCY DEPARTMENT Provider Note   CSN: [id number] Arrival date & time: 04/29/21  1043     History Chief Complaint  Patient presents with   Urinary Retention    Richard Benitez is a 45 y.o. male.  Pt complains of not being able to urinate.  Pt reports this has happened in then past an he had to have a catheter. Pt reports he is on pain medication due to chronic back pain.  Pt denies any increase in back pain.  Pt saw urology 2 years ago for the same. Pt denies fever chills,  No back pain  The history is provided by the patient. No language interpreter was used.  Urinary Frequency      Past Medical History:  Diagnosis Date   Anxiety    Back pain, chronic    Depression    Schizoaffective disorder (HCC)    Substance abuse (HCC)    Tobacco abuse     Patient Active Problem List   Diagnosis Date Noted   Drug overdose    SIRS (systemic inflammatory response syndrome) (HCC)    SOB (shortness of breath)    Overdose 10/06/2014   Leukocytosis 10/06/2014   Acute encephalopathy 11/07/2013   Dehydration 11/07/2013   AKI (acute kidney injury) (HCC) 11/07/2013   Hypokalemia 11/07/2013   Anemia 11/07/2013   Back pain, chronic    Anxiety    Depression    Tobacco abuse    Substance abuse (HCC)     History reviewed. No pertinent surgical history.     No family history on file.  Social History   Tobacco Use   Smoking status: Every Day    Packs/day: 0.50    Types: Cigarettes   Smokeless tobacco: Never  Substance Use Topics   Alcohol use: Not Currently    Comment: former abuse   Drug use: Yes    Types: Marijuana    Comment: 3 weeks ago, hx of polysub. dependence    Home Medications Prior to Admission medications   Medication Sig Start Date End Date Taking? Authorizing Provider  amoxicillin-clavulanate (AUGMENTIN) 875-125 MG per tablet Take 1 tablet by mouth 2 (two) times daily. 10/08/14   Jerald Kief, MD  busPIRone (BUSPAR) 15 MG tablet Take 15  mg by mouth 3 (three) times daily. 10/03/14   [provider]  clonazePAM (KLONOPIN) 1 MG tablet Take 1 mg by mouth 3 (three) times daily.    [provider]  cyclobenzaprine (FLEXERIL) 10 MG tablet Take 2 tablets (20 mg total) by mouth 3 (three) times daily as needed for muscle spasms. Patient taking differently: Take 10 mg by mouth 3 (three) times daily as needed for muscle spasms.  11/08/13   Erick Blinks, MD  HYDROcodone-acetaminophen (NORCO) 10-325 MG per tablet Take 1-2 tablets by mouth 3 (three) times daily as needed for moderate pain or severe pain.     [provider]  morphine (MS CONTIN) 60 MG 12 hr tablet Take 60 mg by mouth every 12 (twelve) hours.    [provider]    Allergies    Patient has no known allergies.  Review of Systems   Review of Systems  Genitourinary:  Positive for frequency.  All other systems reviewed and are negative.  Physical Exam Updated Vital Signs BP 126/73 (BP Location: Right Arm)   Pulse (!) 114   Temp 98.7 F (37.1 C) (Oral)   Resp 18   Ht 6\' 4"  (1.93 m)   Wt  103.9 kg   SpO2 97%   BMI 27.89 kg/m   Physical Exam Vitals and nursing note reviewed.  Constitutional:      Appearance: He is well-developed.  HENT:     Head: Normocephalic.  Cardiovascular:     Rate and Rhythm: Normal rate and regular rhythm.     Pulses: Normal pulses.  Pulmonary:     Effort: Pulmonary effort is normal.  Abdominal:     General: There is no distension.  Musculoskeletal:        General: Normal range of motion.     Cervical back: Normal range of motion.  Skin:    General: Skin is warm.  Neurological:     Mental Status: He is alert and oriented to person, place, and time.  Psychiatric:        Mood and Affect: Mood normal.    ED Results / Procedures / Treatments   Labs (all labs ordered are listed, but only abnormal results are displayed) Labs Reviewed  URINALYSIS, ROUTINE W REFLEX MICROSCOPIC     EKG None  Radiology No results found.  Procedures Procedures   Medications Ordered in ED Medications - No data to display  ED Course  I have reviewed the triage vital signs and the nursing notes.  Pertinent labs & imaging results that were available during my care of the patient were reviewed by me and considered in my medical decision making (see chart for details).    MDM Rules/Calculators/A&P                           MDM:  Foley placed, Ua is normal.  Pt does not want to keep foley.  Dr. Rhunette Croft discussed with pt.  Pt states he will return if any problems.  Final Clinical Impression(s) / ED Diagnoses Final diagnoses:  Urinary retention    Rx / DC Orders ED Discharge Orders     None     An After Visit Summary was printed and given to the patient.    Elson Areas, PA-C 04/29/21 1806    Elson Areas, PA-C 04/29/21 1826    Derwood Kaplan, MD 05/01/21 [phone]

## 2021-04-29 NOTE — Discharge Instructions (Addendum)
Follow up with your Urologist for recheck

## 2021-04-29 NOTE — ED Notes (Signed)
Bladder scan: greater than 999 mls

## 2021-04-29 NOTE — ED Triage Notes (Signed)
No urine output for over 24 hours

## 2021-06-26 ENCOUNTER — Other Ambulatory Visit: Payer: Self-pay

## 2021-06-26 ENCOUNTER — Ambulatory Visit (INDEPENDENT_AMBULATORY_CARE_PROVIDER_SITE_OTHER): Payer: Medicare Other | Admitting: Otolaryngology

## 2021-06-26 DIAGNOSIS — H6123 Impacted cerumen, bilateral: Secondary | ICD-10-CM

## 2021-06-26 NOTE — Progress Notes (Signed)
HPI: Richard Benitez is a 45 y.o. male who presents for evaluation of blockage of his left ear with cerumen.  Over the past few weeks he has noticed blockage of his left ear.  He has tried hydrogen peroxide.  Presents here to have his ear cleaned..  Past Medical History:  Diagnosis Date   Anxiety    Back pain, chronic    Depression    Schizoaffective disorder (HCC)    Substance abuse (HCC)    Tobacco abuse    No past surgical history on file. Social History   Socioeconomic History   Marital status: Single    Spouse name: Not on file   Number of children: Not on file   Years of education: Not on file   Highest education level: Not on file  Occupational History   Not on file  Tobacco Use   Smoking status: Every Day    Packs/day: 0.50    Types: Cigarettes   Smokeless tobacco: Never  Substance and Sexual Activity   Alcohol use: Not Currently    Comment: former abuse   Drug use: Yes    Types: Marijuana    Comment: 3 weeks ago, hx of polysub. dependence   Sexual activity: Not Currently  Other Topics Concern   Not on file  Social History Narrative   Not on file   Social Determinants of Health   Financial Resource Strain: Not on file  Food Insecurity: Not on file  Transportation Needs: Not on file  Physical Activity: Not on file  Stress: Not on file  Social Connections: Not on file   No family history on file. No Known Allergies Prior to Admission medications   Medication Sig Start Date End Date Taking? Authorizing Provider  amoxicillin-clavulanate (AUGMENTIN) 875-125 MG per tablet Take 1 tablet by mouth 2 (two) times daily. 10/08/14   Jerald Kief, MD  busPIRone (BUSPAR) 15 MG tablet Take 15 mg by mouth 3 (three) times daily. 10/03/14   [provider]  clonazePAM (KLONOPIN) 1 MG tablet Take 1 mg by mouth 3 (three) times daily.    [provider]  cyclobenzaprine (FLEXERIL) 10 MG tablet Take 2 tablets (20 mg total) by mouth 3 (three) times daily as  needed for muscle spasms. Patient taking differently: Take 10 mg by mouth 3 (three) times daily as needed for muscle spasms.  11/08/13   Erick Blinks, MD  HYDROcodone-acetaminophen (NORCO) 10-325 MG per tablet Take 1-2 tablets by mouth 3 (three) times daily as needed for moderate pain or severe pain.     [provider]  morphine (MS CONTIN) 60 MG 12 hr tablet Take 60 mg by mouth every 12 (twelve) hours.    [provider]  tamsulosin (FLOMAX) 0.4 MG CAPS capsule Take 1 capsule (0.4 mg total) by mouth daily. 04/29/21   Elson Areas, PA-C     Positive ROS: Otherwise negative  All other systems have been reviewed and were otherwise negative with the exception of those mentioned in the HPI and as above.  Physical Exam: Constitutional: Alert, well-appearing, no acute distress Ears: External ears without lesions or tenderness. Ear canals right ear canal with a small amount of cerumen that was nonobstructing and was removed with a curette.  The left ear canal which is a little bit smaller in the lateral portion was cleaned with hydrogen peroxide and suction.  The ear canal and TM were completely occluded with cerumen.  After cleaning the ear canal hearing was much  better.. Nasal: External nose without lesions. Clear nasal passages Oral: Oropharynx clear. Neck: No palpable adenopathy or masses Respiratory: Breathing comfortably  Skin: No facial/neck lesions or rash noted.  Cerumen impaction removal  Date/Time: 06/26/2021 1:41 PM Performed by: Drema Halon, MD Authorized by: Drema Halon, MD   Consent:    Consent obtained:  Verbal   Consent given by:  Patient   Risks discussed:  Pain and bleeding Procedure details:    Location:  L ear and R ear   Procedure type: curette and suction   Post-procedure details:    Inspection:  TM intact and canal normal   Hearing quality:  Improved   Procedure completion:  Tolerated well, no immediate  complications Comments:     Minimal wax buildup on the right side.  Large amount of wax on the left side adjacent to the left TM that was cleaned with suction.  TMs were otherwise clear.  Assessment: Cerumen impaction with blockage of his hearing in the left ear.  Plan: Ear canals were cleaned in the office with improved hearing. He will follow-up with one of the other ENT practices in the future.  Narda Bonds, MD

## 2021-07-27 DIAGNOSIS — R739 Hyperglycemia, unspecified: Secondary | ICD-10-CM | POA: Diagnosis not present

## 2021-07-27 DIAGNOSIS — I10 Essential (primary) hypertension: Secondary | ICD-10-CM | POA: Diagnosis not present

## 2021-07-27 DIAGNOSIS — E785 Hyperlipidemia, unspecified: Secondary | ICD-10-CM | POA: Diagnosis not present

## 2021-07-30 DIAGNOSIS — R7303 Prediabetes: Secondary | ICD-10-CM | POA: Diagnosis not present

## 2021-08-03 DIAGNOSIS — R82998 Other abnormal findings in urine: Secondary | ICD-10-CM | POA: Diagnosis not present

## 2021-08-03 DIAGNOSIS — Z1212 Encounter for screening for malignant neoplasm of rectum: Secondary | ICD-10-CM | POA: Diagnosis not present

## 2021-11-01 DIAGNOSIS — M5489 Other dorsalgia: Secondary | ICD-10-CM | POA: Diagnosis not present

## 2021-11-01 DIAGNOSIS — Z720 Tobacco use: Secondary | ICD-10-CM | POA: Diagnosis not present

## 2021-11-01 DIAGNOSIS — K5909 Other constipation: Secondary | ICD-10-CM | POA: Diagnosis not present

## 2021-11-01 DIAGNOSIS — E669 Obesity, unspecified: Secondary | ICD-10-CM | POA: Diagnosis not present

## 2021-11-01 DIAGNOSIS — I10 Essential (primary) hypertension: Secondary | ICD-10-CM | POA: Diagnosis not present

## 2021-11-01 DIAGNOSIS — R7303 Prediabetes: Secondary | ICD-10-CM | POA: Diagnosis not present

## 2022-01-30 DIAGNOSIS — F1910 Other psychoactive substance abuse, uncomplicated: Secondary | ICD-10-CM | POA: Diagnosis not present

## 2022-01-30 DIAGNOSIS — I10 Essential (primary) hypertension: Secondary | ICD-10-CM | POA: Diagnosis not present

## 2022-01-30 DIAGNOSIS — M5489 Other dorsalgia: Secondary | ICD-10-CM | POA: Diagnosis not present

## 2022-01-30 DIAGNOSIS — R7989 Other specified abnormal findings of blood chemistry: Secondary | ICD-10-CM | POA: Diagnosis not present

## 2022-01-30 DIAGNOSIS — K5909 Other constipation: Secondary | ICD-10-CM | POA: Diagnosis not present

## 2022-01-30 DIAGNOSIS — E785 Hyperlipidemia, unspecified: Secondary | ICD-10-CM | POA: Diagnosis not present

## 2022-01-30 DIAGNOSIS — Z720 Tobacco use: Secondary | ICD-10-CM | POA: Diagnosis not present

## 2022-01-30 DIAGNOSIS — R7303 Prediabetes: Secondary | ICD-10-CM | POA: Diagnosis not present

## 2022-01-30 DIAGNOSIS — F259 Schizoaffective disorder, unspecified: Secondary | ICD-10-CM | POA: Diagnosis not present

## 2022-01-30 DIAGNOSIS — E669 Obesity, unspecified: Secondary | ICD-10-CM | POA: Diagnosis not present

## 2022-03-15 DIAGNOSIS — H9193 Unspecified hearing loss, bilateral: Secondary | ICD-10-CM | POA: Diagnosis not present

## 2022-03-15 DIAGNOSIS — H6123 Impacted cerumen, bilateral: Secondary | ICD-10-CM | POA: Diagnosis not present

## 2022-03-29 DIAGNOSIS — R7989 Other specified abnormal findings of blood chemistry: Secondary | ICD-10-CM | POA: Diagnosis not present

## 2022-03-29 DIAGNOSIS — I10 Essential (primary) hypertension: Secondary | ICD-10-CM | POA: Diagnosis not present

## 2022-03-29 DIAGNOSIS — E785 Hyperlipidemia, unspecified: Secondary | ICD-10-CM | POA: Diagnosis not present

## 2022-11-20 DIAGNOSIS — Z79899 Other long term (current) drug therapy: Secondary | ICD-10-CM | POA: Diagnosis not present

## 2022-11-20 DIAGNOSIS — R7303 Prediabetes: Secondary | ICD-10-CM | POA: Diagnosis not present

## 2022-11-20 DIAGNOSIS — I10 Essential (primary) hypertension: Secondary | ICD-10-CM | POA: Diagnosis not present

## 2022-11-20 DIAGNOSIS — E785 Hyperlipidemia, unspecified: Secondary | ICD-10-CM | POA: Diagnosis not present

## 2022-11-20 DIAGNOSIS — K219 Gastro-esophageal reflux disease without esophagitis: Secondary | ICD-10-CM | POA: Diagnosis not present

## 2022-11-20 DIAGNOSIS — Z125 Encounter for screening for malignant neoplasm of prostate: Secondary | ICD-10-CM | POA: Diagnosis not present

## 2022-11-27 DIAGNOSIS — Z720 Tobacco use: Secondary | ICD-10-CM | POA: Diagnosis not present

## 2022-11-27 DIAGNOSIS — F22 Delusional disorders: Secondary | ICD-10-CM | POA: Diagnosis not present

## 2022-11-27 DIAGNOSIS — M5489 Other dorsalgia: Secondary | ICD-10-CM | POA: Diagnosis not present

## 2022-11-27 DIAGNOSIS — E785 Hyperlipidemia, unspecified: Secondary | ICD-10-CM | POA: Diagnosis not present

## 2022-11-27 DIAGNOSIS — Z0000 Encounter for general adult medical examination without abnormal findings: Secondary | ICD-10-CM | POA: Diagnosis not present

## 2022-11-27 DIAGNOSIS — E669 Obesity, unspecified: Secondary | ICD-10-CM | POA: Diagnosis not present

## 2022-11-27 DIAGNOSIS — F1910 Other psychoactive substance abuse, uncomplicated: Secondary | ICD-10-CM | POA: Diagnosis not present

## 2022-11-27 DIAGNOSIS — R82998 Other abnormal findings in urine: Secondary | ICD-10-CM | POA: Diagnosis not present

## 2022-11-27 DIAGNOSIS — F259 Schizoaffective disorder, unspecified: Secondary | ICD-10-CM | POA: Diagnosis not present

## 2022-11-27 DIAGNOSIS — I10 Essential (primary) hypertension: Secondary | ICD-10-CM | POA: Diagnosis not present

## 2022-12-17 ENCOUNTER — Emergency Department (HOSPITAL_COMMUNITY)
Admission: EM | Admit: 2022-12-17 | Discharge: 2022-12-18 | Disposition: A | Discharge status: Home / Self Care | Payer: Medicare HMO | Attending: Emergency Medicine | Admitting: Emergency Medicine

## 2022-12-17 ENCOUNTER — Other Ambulatory Visit: Payer: Self-pay

## 2022-12-17 ENCOUNTER — Encounter (HOSPITAL_COMMUNITY): Payer: Self-pay

## 2022-12-17 DIAGNOSIS — F1110 Opioid abuse, uncomplicated: Secondary | ICD-10-CM | POA: Diagnosis not present

## 2022-12-17 DIAGNOSIS — Y900 Blood alcohol level of less than 20 mg/100 ml: Secondary | ICD-10-CM | POA: Insufficient documentation

## 2022-12-17 DIAGNOSIS — G4700 Insomnia, unspecified: Secondary | ICD-10-CM | POA: Insufficient documentation

## 2022-12-17 DIAGNOSIS — D72829 Elevated white blood cell count, unspecified: Secondary | ICD-10-CM | POA: Diagnosis not present

## 2022-12-17 DIAGNOSIS — R440 Auditory hallucinations: Secondary | ICD-10-CM | POA: Diagnosis not present

## 2022-12-17 DIAGNOSIS — R000 Tachycardia, unspecified: Secondary | ICD-10-CM | POA: Diagnosis not present

## 2022-12-17 DIAGNOSIS — Z91148 Patient's other noncompliance with medication regimen for other reason: Secondary | ICD-10-CM

## 2022-12-17 DIAGNOSIS — Z91199 Patient's noncompliance with other medical treatment and regimen due to unspecified reason: Secondary | ICD-10-CM | POA: Insufficient documentation

## 2022-12-17 DIAGNOSIS — Z1152 Encounter for screening for COVID-19: Secondary | ICD-10-CM | POA: Insufficient documentation

## 2022-12-17 DIAGNOSIS — R030 Elevated blood-pressure reading, without diagnosis of hypertension: Secondary | ICD-10-CM

## 2022-12-17 DIAGNOSIS — F1910 Other psychoactive substance abuse, uncomplicated: Secondary | ICD-10-CM | POA: Diagnosis not present

## 2022-12-17 DIAGNOSIS — F323 Major depressive disorder, single episode, severe with psychotic features: Secondary | ICD-10-CM | POA: Insufficient documentation

## 2022-12-17 DIAGNOSIS — Z79899 Other long term (current) drug therapy: Secondary | ICD-10-CM | POA: Diagnosis not present

## 2022-12-17 DIAGNOSIS — F17210 Nicotine dependence, cigarettes, uncomplicated: Secondary | ICD-10-CM | POA: Insufficient documentation

## 2022-12-17 DIAGNOSIS — F259 Schizoaffective disorder, unspecified: Secondary | ICD-10-CM | POA: Diagnosis not present

## 2022-12-17 DIAGNOSIS — F1310 Sedative, hypnotic or anxiolytic abuse, uncomplicated: Secondary | ICD-10-CM | POA: Insufficient documentation

## 2022-12-17 DIAGNOSIS — I10 Essential (primary) hypertension: Secondary | ICD-10-CM | POA: Diagnosis not present

## 2022-12-17 DIAGNOSIS — F419 Anxiety disorder, unspecified: Secondary | ICD-10-CM

## 2022-12-17 LAB — CBC
HCT: 45.9 % (ref 39.0–52.0)
Hemoglobin: 15.3 g/dL (ref 13.0–17.0)
MCH: 30.5 pg (ref 26.0–34.0)
MCHC: 33.3 g/dL (ref 30.0–36.0)
MCV: 91.4 fL (ref 80.0–100.0)
Platelets: 280 10*3/uL (ref 150–400)
RBC: 5.02 MIL/uL (ref 4.22–5.81)
RDW: 12.9 % (ref 11.5–15.5)
WBC: 11.9 10*3/uL — ABNORMAL HIGH (ref 4.0–10.5)
nRBC: 0 % (ref 0.0–0.2)

## 2022-12-17 LAB — COMPREHENSIVE METABOLIC PANEL
ALT: 31 U/L (ref 0–44)
AST: 15 U/L (ref 15–41)
Albumin: 4.6 g/dL (ref 3.5–5.0)
Alkaline Phosphatase: 101 U/L (ref 38–126)
Anion gap: 10 (ref 5–15)
BUN: 16 mg/dL (ref 6–20)
CO2: 27 mmol/L (ref 22–32)
Calcium: 9.7 mg/dL (ref 8.9–10.3)
Chloride: 101 mmol/L (ref 98–111)
Creatinine, Ser: 1.02 mg/dL (ref 0.61–1.24)
GFR, Estimated: >60 mL/min (ref >60)
Glucose, Bld: 107 mg/dL — ABNORMAL HIGH (ref 70–99)
Potassium: 4.4 mmol/L (ref 3.5–5.1)
Sodium: 138 mmol/L (ref 135–145)
Total Bilirubin: 0.5 mg/dL (ref 0.3–1.2)
Total Protein: 7.8 g/dL (ref 6.5–8.1)

## 2022-12-17 LAB — SALICYLATE LEVEL: Salicylate Lvl: <7 mg/dL — ABNORMAL LOW (ref 7.0–30.0)

## 2022-12-17 LAB — ETHANOL: Alcohol, Ethyl (B): <10 mg/dL (ref <10)

## 2022-12-17 LAB — ACETAMINOPHEN LEVEL: Acetaminophen (Tylenol), Serum: <10 ug/mL — ABNORMAL LOW (ref 10–30)

## 2022-12-17 MED ORDER — TRAZODONE HCL 50 MG PO TABS: 100.0000 mg | ORAL_TABLET | Freq: Every day | ORAL | Status: DC

## 2022-12-17 MED ORDER — TAMSULOSIN HCL 0.4 MG PO CAPS
0.4000 mg | ORAL_CAPSULE | Freq: Every day | ORAL | Status: DC
  Administered 2022-12-18: 0.4 mg via ORAL
  Filled 2022-12-17: qty 1

## 2022-12-17 MED ORDER — TRAZODONE HCL 50 MG PO TABS
100.0000 mg | ORAL_TABLET | Freq: Once | ORAL | Status: AC
  Administered 2022-12-17: 100 mg via ORAL
  Filled 2022-12-17: qty 2

## 2022-12-17 MED ORDER — ALPRAZOLAM 0.5 MG PO TABS
1.0000 mg | ORAL_TABLET | Freq: Three times a day (TID) | ORAL | Status: DC | PRN
  Administered 2022-12-18: 1 mg via ORAL
  Filled 2022-12-17: qty 2

## 2022-12-17 MED ORDER — CYCLOBENZAPRINE HCL 10 MG PO TABS
10.0000 mg | ORAL_TABLET | Freq: Three times a day (TID) | ORAL | Status: DC
  Administered 2022-12-18 (×2): 10 mg via ORAL
  Filled 2022-12-17 (×2): qty 1

## 2022-12-17 MED ORDER — ROSUVASTATIN CALCIUM 5 MG PO TABS
5.0000 mg | ORAL_TABLET | Freq: Every day | ORAL | Status: DC
  Administered 2022-12-18: 5 mg via ORAL
  Filled 2022-12-17: qty 1

## 2022-12-17 MED ORDER — METOPROLOL SUCCINATE ER 25 MG PO TB24
25.0000 mg | ORAL_TABLET | Freq: Every day | ORAL | Status: DC
  Administered 2022-12-18: 25 mg via ORAL
  Filled 2022-12-17: qty 1

## 2022-12-17 NOTE — BH Assessment (Signed)
Comprehensive Clinical Assessment (CCA) Note  12/17/2022 [address] [id number]  Chief Complaint:  Chief Complaint  Patient presents with   Psychiatric Evaluation   Visit Diagnosis:  F32.3 Major depressive disorder, Single episode, With psychotic features Flowsheet Row ED from 12/17/2022 in [facility] Emergency Department at [facility] ED from 04/29/2021 in [facility] Emergency Department at [facility] No Risk      The patient demonstrates the following risk factors for suicide: Chronic risk factors for suicide include: psychiatric disorder of delusional disorder and previous suicide attempts accidental overdose . Acute risk factors for suicide include: social withdrawal/isolation. Protective factors for this patient include: positive social support, positive therapeutic relationship, and coping skills. Considering these factors, the overall suicide risk at this point appears to be low. Patient is not appropriate for outpatient follow up.   Disposition: Evette Georges NP, recommends overnight observation and to be reassessed by psychiatry.  Disposition discussed with Stanford Scotland.  RN to discuss disposition with EDP.  Low risk = tele sitter  Richard Benitez. Richard Benitez is a 47 year old male who presents voluntarily to [place] and accompanied by his mother, Angell Gainous. [phone], who participated in assessment at Pt's request.  Pt reports suicidal thoughts, "I will not hurt myself, I have thoughts sometimes".  Pt reports hearing voices, "I hear about eleven voices, telling me to kill myself".  Pt denies HI, VH and Paranoia.  Pt reports a history of depression disorder.  Pt mother reports "this has been going on about three weeks".  Pt acknowledged the following symptoms:  loneliness, sadness, confusion, suspiciousness, poor judgment, forgetfulness, fatigue, hopelessness, and irritable.  Pt mother reports he have not slept in three days.  Pt also reports  eating one meal daily.  Pt denies drinking alcohol, "I have not drank alcohol in ten years".  Pt also denies any other substance use.  Pt reports smoking fifteen cigarettes daily.  Pt unable to identify his primary stressor.  Pt reports living with his parents; also, reports that he receives disability funds.  Pt reports his parents are his support person.  Pt reports his mother and father have a history of depression disorder.  Pt denies any family substance used.  Pt denies any history of abuse or trauma.  Pt denies any current legal problems.  Pt reports a open gun (riffled) is kept in the house and it is unlocked.  Pt says he is not currently receiving outpatient therapy; also, reports receiving outpatient medication to help him sleep.  Pt's mother reports that he is not currently taking prescribed medication.  Pt reports one previous inpatient psychiatric hospitalization in 2020.  Pt is dressed in scrubs, alert,oriented x 3 with slow speech and calm motor behavior.  Eye contact avoided.  Pt's mood is depressed and affect is depressed.  Thought process is coherent.  Pt's insight is fair and lacking.  Pt judgment is fair.  There is no indication Pt is currently responding to internal stimuli or experiencing delusional thought content.  Pt was suspicious throughout assessment.    CCA Screening, Triage and Referral (STR)  Patient Reported Information How did you hear about [place]? Family/Friend  What Is the Reason for Your Visit/Call Today? Delusional, AH  How Long Has This Been Causing You Problems? 1 wk - 1 month  What Do You Feel Would Help You the Most Today? Treatment for Depression or other mood problem; Medication(s)   Have You Recently Had Any  Thoughts About Hurting Yourself? Yes  Are You Planning to Commit Suicide/Harm Yourself At This time? No   Flowsheet Row ED from 12/17/2022 in [facility] Emergency Department at [facility] ED from 04/29/2021 in [facility] Emergency  Department at [place] No Risk       Have you Recently Had Thoughts About [place]? No  Are You Planning to Harm Someone at This Time? No  Explanation: n/a   Have You Used Any Alcohol or Drugs in the Past 24 Hours? No  What Did You Use and How Much? n/a   Do You Currently Have a Therapist/Psychiatrist? No  Name of Therapist/Psychiatrist: Name of Therapist/Psychiatrist: n/a   Have You Been Recently Discharged From Any Office Practice or Programs? No  Explanation of Discharge From Practice/Program: n/a     CCA Screening Triage Referral Assessment Type of Contact: Tele-Assessment  Telemedicine Service Delivery: Telemedicine service delivery: This service was provided via telemedicine using a 2-way, interactive audio and video technology  Is this Initial or Reassessment? Is this Initial or Reassessment?: Initial Assessment  Date Telepsych consult ordered in [facility]:  Date Telepsych consult ordered in [facility]: 12/17/22  Time Telepsych consult ordered in [facility]:    Location of Assessment: [facility] ED  Provider Location: [facility] Assessment Services   Collateral Involvement: Pt's mother, Shuan Kubick, [phone] particpated in assessment.   Does Patient Have a Stage manager Guardian? No  Legal Guardian Contact Information: n/a  Copy of Legal Guardianship Form: -- (n/a)  Legal Guardian Notified of Arrival: -- (n/a)  Legal Guardian Notified of Pending Discharge: -- (n/a)  If Minor and Not Living with Parent(s), Who has Custody? n/a  Is CPS involved or ever been involved? Never  Is APS involved or ever been involved? Never   Patient Determined To Be At Risk for Harm To Self or Others Based on Review of Patient Reported Information or Presenting Complaint? No  Method: No Plan  Availability of Means: No access or NA  Intent: Vague intent or NA  Notification Required: No need or identified person  Additional  Information for Danger to Others Potential: -- (n/a)  Additional Comments for Danger to Others Potential: n/a  Are There Guns or Other Weapons in Your Home? Yes  Types of Guns/Weapons: Pt reports a gun (riffle) is in the home, not locked  Are These Weapons Safely Secured?                            No  Who Could Verify You Are Able To Have These Secured: Pt's mom report one gun is kept open in the home  Do You Have any Outstanding Charges, Pending Court Dates, Parole/Probation? No  Contacted To Inform of Risk of Harm To Self or Others: Family/Significant Other:    Does Patient Present under Involuntary Commitment? No    [place] of Residence: [place]   Patient Currently Receiving the Following Services: Medication Management   Determination of Need: Urgent (48 hours)   Options For Referral: Inpatient Hospitalization     CCA Biopsychosocial Patient Reported Schizophrenia/Schizoaffective Diagnosis in Past: No   Strengths: Pt explaining his diagnosis   Mental Health Symptoms Depression:   Hopelessness; Difficulty Concentrating; Sleep (too much or little); Worthlessness   Duration of Depressive symptoms:  Duration of Depressive Symptoms: Greater than two weeks   Mania:   None   Anxiety:    Irritability;  Restlessness; Worrying   Psychosis:   Delusions   Duration of Psychotic symptoms:  Duration of Psychotic Symptoms: Less than six months   Trauma:   None   Obsessions:   None   Compulsions:   None   Inattention:   None   Hyperactivity/Impulsivity:   None   Oppositional/Defiant Behaviors:   None   Emotional Irregularity:   Potentially harmful impulsivity; Transient, stress-related paranoia/disassociation   Other Mood/Personality Symptoms:   Depressed/Insomnia    Mental Status Exam Appearance and self-care  Stature:   Average   Weight:   Average weight   Clothing:   -- (Pt dressed in scrubs.)   Grooming:   Normal   Cosmetic  use:   Age appropriate   Posture/gait:   Normal   Motor activity:   Slowed   Sensorium  Attention:   Confused; Inattentive   Concentration:   Scattered   Orientation:   Object; Person; Place   Recall/memory:   Normal   Affect and Mood  Affect:   Depressed; Flat   Mood:   Depressed; Hopeless; Irritable   Relating  Eye contact:   Avoided   Facial expression:   Depressed; Sad   Attitude toward examiner:   Cooperative; Guarded; Uninterested   Thought and Language  Speech flow:  Articulation error; Slow   Thought content:   Suspicious   Preoccupation:   None   Hallucinations:   Auditory   Organization:   Disorganized   Transport planner of Knowledge:   Fair   Intelligence:   Needs investigation   Abstraction:   Functional   Judgement:   Fair   Reality Testing:   Unaware; Variable   Insight:   Fair; Lacking; Shallow   Decision Making:   Confused   Social Functioning  Social Maturity:   Isolates   Social Judgement:   Heedless   Stress  Stressors:   Transitions   Coping Ability:   Normal   Skill Deficits:   Self-care; Decision making; Communication   Supports:   Family     Religion: Religion/Spirituality Are You A Religious Person?:  (n/a) How Might This Affect Treatment?: did not assed.  Leisure/Recreation: Leisure / Recreation Do You Have Hobbies?: No  Exercise/Diet: Exercise/Diet Do You Exercise?: No Have You Gained or Lost A Significant Amount of Weight in the Past Six Months?: No Do You Follow a Special Diet?: No (Pt reprots eating one meal daily) Do You Have Any Trouble Sleeping?: Yes Explanation of Sleeping Difficulties: Pt mother reports "he has not slept in three days"   CCA Employment/Education Employment/Work Situation: Employment / Work Situation Employment Situation: On disability Why is Patient on Disability: spine How Long has Patient Been on Disability: 2007 Patient's Job has  Been Impacted by Current Illness: No Has Patient ever Been in the [employer]?: No  Education: Education Is Patient Currently Attending School?: No Last Grade Completed: 12 (Pt obtain a GED) Did [place]?: No Did You Have An Individualized Education Program (IIEP): No Did You Have Any Difficulty At School?: No Patient's Education Has Been Impacted by Current Illness: No   CCA Family/Childhood History Family and Relationship History: Family history Marital status: Single Does patient have children?: No  Childhood History:  Childhood History By whom was/is the patient raised?: Both parents Did patient suffer any verbal/emotional/physical/sexual abuse as a child?: No Did patient suffer from severe childhood neglect?: No Has patient ever been sexually abused/assaulted/raped as an adolescent or adult?: No Was the patient  ever a victim of a crime or a disaster?: No Witnessed domestic violence?: No Has patient been affected by domestic violence as an adult?: No       CCA Substance Use Alcohol/Drug Use: Alcohol / Drug Use Pain Medications: See MRA Prescriptions: See MRA Over the Counter: See MRA History of alcohol / drug use?: No history of alcohol / drug abuse Longest period of sobriety (when/how long):  (n/a) Negative Consequences of Use:  (n/a) Withdrawal Symptoms:  (n/a)                         ASAM's:  Six Dimensions of Multidimensional Assessment  Dimension 1:  Acute Intoxication and/or Withdrawal Potential:   Dimension 1:  Description of individual's past and current experiences of substance use and withdrawal:  (n/a)  Dimension 2:  Biomedical Conditions and Complications:   Dimension 2:  Description of patient's biomedical conditions and  complications:  (n/a)  Dimension 3:  Emotional, Behavioral, or Cognitive Conditions and Complications:  Dimension 3:  Description of emotional, behavioral, or cognitive conditions and complications:  (n/a)   Dimension 4:  Readiness to Change:  Dimension 4:  Description of Readiness to Change criteria:  (n/a)  Dimension 5:  Relapse, Continued use, or Continued Problem Potential:  Dimension 5:  Relapse, continued use, or continued problem potential critiera description:  (n/a)  Dimension 6:  Recovery/Living Environment:  Dimension 6:  Recovery/Iiving environment criteria description:  (n/a)  ASAM Severity Score:    ASAM Recommended Level of Treatment: ASAM Recommended Level of Treatment:  (n/a)   Substance use Disorder (SUD) Substance Use Disorder (SUD)  Checklist Symptoms of Substance Use:  (n/a)  Recommendations for Services/Supports/Treatments: Recommendations for Services/Supports/Treatments Recommendations For Services/Supports/Treatments: Medication Management, Inpatient Hospitalization  Discharge Disposition:    DSM5 Diagnoses: Patient Active Problem List   Diagnosis Date Noted   Drug overdose    SIRS (systemic inflammatory response syndrome)    SOB (shortness of breath)    Overdose 10/06/2014   Leukocytosis 10/06/2014   Acute encephalopathy 11/07/2013   Dehydration 11/07/2013   AKI (acute kidney injury) 11/07/2013   Hypokalemia 11/07/2013   Anemia 11/07/2013   Back pain, chronic    Anxiety    Depression    Tobacco abuse    Substance abuse      Referrals to Alternative Service(s): Referred to Alternative Service(s):   Place:   Date:   Time:    Referred to Alternative Service(s):   Place:   Date:   Time:    Referred to Alternative Service(s):   Place:   Date:   Time:    Referred to Alternative Service(s):   Place:   Date:   Time:     Leonides Schanz, Counselor

## 2022-12-17 NOTE — ED Provider Notes (Signed)
[facility] Provider Note   CSN: [id number] Arrival date & time: 12/17/22  1710     History Chief Complaint  Patient presents with  . Psychiatric Evaluation    Richard Benitez is a 47 y.o. male.  Patient presents emergency department complaints of hallucinations.  Patient has a past history significant for anxiety and a previously been taking Xanax, trazodone, Haldol.  He reports he has not had Haldol in the last 2 months or so.  He describes that these voices are threatening to kill him but he denies any thoughts of suicide or homicidal ideation.  He reports that he had previously stopped taking Tylenol but did not like the way it made him feel.  He is now also reporting he has not been sleeping well for the last 7 days or so and reports a total of 25 hours of sleep during this period of time.  He reports he has been taking trazodone but his mother debates this as his medication bottle was not was entirely full.  Also concern the patient has not been taking the metoprolol as this bottle is also been almost entirely full.  HPI     Home Medications Prior to Admission medications   Medication Sig Start Date End Date Taking? Authorizing Provider  ALPRAZolam Duanne Moron) 1 MG tablet Take 1 mg by mouth 3 (three) times daily as needed.   Yes [provider]  chlorhexidine (PERIDEX) 0.12 % solution 2 (two) times daily. 10/25/22  Yes [provider]  clonazePAM (KLONOPIN) 1 MG tablet Take 1 mg by mouth 3 (three) times daily.   Yes [provider]  cyclobenzaprine (FLEXERIL) 10 MG tablet Take 2 tablets (20 mg total) by mouth 3 (three) times daily as needed for muscle spasms. Patient taking differently: Take 10 mg by mouth 3 (three) times daily as needed for muscle spasms. 11/08/13  Yes Kathie Dike, MD  HYDROcodone-acetaminophen (NORCO) 10-325 MG per tablet Take 1-2 tablets by mouth 3 (three) times daily as needed for moderate pain  or severe pain.    Yes [provider]  metoprolol succinate (TOPROL-XL) 25 MG 24 hr tablet Take 25 mg by mouth daily.   Yes [provider]  morphine (MS CONTIN) 60 MG 12 hr tablet Take 60 mg by mouth every 12 (twelve) hours.   Yes [provider]  tamsulosin (FLOMAX) 0.4 MG CAPS capsule Take 1 capsule (0.4 mg total) by mouth daily. 04/29/21  Yes Caryl Ada K, PA-C  rosuvastatin (CRESTOR) 5 MG tablet Take 5 mg by mouth daily.    [provider]  traZODone (DESYREL) 100 MG tablet Take 200 mg by mouth at bedtime. Patient not taking: Reported on 12/17/2022    [provider]      Allergies    Patient has no known allergies.    Review of Systems   Review of Systems  Psychiatric/Behavioral:  Positive for hallucinations.   All other systems reviewed and are negative.   Physical Exam Updated Vital Signs BP (!) 112/92 (BP Location: Right Arm)   Pulse (!) 132   Temp 98.4 F (36.9 C) (Oral)   Ht 6\' 4"  (1.93 m)   Wt 98.7 kg   SpO2 95%   BMI 26.49 kg/m  Physical Exam Vitals and nursing note reviewed.  Constitutional:      General: He is not in acute distress.    Appearance: He is well-developed.  HENT:     Head: Normocephalic  and atraumatic.  Eyes:     Conjunctiva/sclera: Conjunctivae normal.  Cardiovascular:     Rate and Rhythm: Normal rate and regular rhythm.     Heart sounds: No murmur heard. Pulmonary:     Effort: Pulmonary effort is normal. No respiratory distress.     Breath sounds: Normal breath sounds.  Abdominal:     Palpations: Abdomen is soft.     Tenderness: There is no abdominal tenderness.  Musculoskeletal:        General: No swelling.     Cervical back: Neck supple.  Skin:    General: Skin is warm and dry.     Capillary Refill: Capillary refill takes less than 2 seconds.  Neurological:     Mental Status: He is alert.  Psychiatric:        Attention and Perception: He is inattentive. He perceives auditory  hallucinations.        Mood and Affect: Affect is blunt.        Speech: Speech is delayed.        Behavior: Behavior is slowed.     ED Results / Procedures / Treatments   Labs (all labs ordered are listed, but only abnormal results are displayed) Labs Reviewed  COMPREHENSIVE METABOLIC PANEL - Abnormal; Notable for the following components:      Result Value   Glucose, Bld 107 (*)    All other components within normal limits  SALICYLATE LEVEL - Abnormal; Notable for the following components:   Salicylate Lvl [id number] (*)    All other components within normal limits  ACETAMINOPHEN LEVEL - Abnormal; Notable for the following components:   Acetaminophen (Tylenol), Serum <10 (*)    All other components within normal limits  CBC - Abnormal; Notable for the following components:   WBC 11.9 (*)    All other components within normal limits  RESP PANEL BY RT-PCR (RSV, FLU A&B, COVID)  [id number]  ETHANOL  RAPID URINE DRUG SCREEN, HOSP PERFORMED    EKG None  Radiology No results found.  Procedures Procedures   Medications Ordered in ED Medications  cyclobenzaprine (FLEXERIL) tablet 10 mg (has no administration in time range)  traZODone (DESYREL) tablet 100 mg (has no administration in time range)  ALPRAZolam (XANAX) tablet 1 mg (has no administration in time range)  metoprolol succinate (TOPROL-XL) 24 hr tablet 25 mg (has no administration in time range)  rosuvastatin (CRESTOR) tablet 5 mg (has no administration in time range)  tamsulosin (FLOMAX) capsule 0.4 mg (has no administration in time range)  traZODone (DESYREL) tablet 100 mg (100 mg Oral Given 12/17/22 2050)    ED Course/ Medical Decision Making/ A&P Clinical Course as of 12/17/22 2312  Tue Dec 17, 2022  2311 Labs largely reassuring. Negative tylenol, salicylate, and ethanol. Still pending respiratory panel and UDS. [OZ]    Clinical Course User Index [OZ] Luvenia Heller, PA-C                           Medical Decision  Making Amount and/or Complexity of Data Reviewed Labs: ordered.  Risk Prescription drug management.   This patient presents to the ED for concern of psychiatric evaluation.  Differential diagnosis includes depression, panic attack, bipolar disorder, schizoaffective disorder   Lab Tests:  I Ordered, and personally interpreted labs.  The pertinent results include: CBC with mild leukocytosis, normal CMP, normal acetaminophen, normal salicylate, normal ethanol pending UDS, pending RVP   Medicines ordered and prescription  drug management:  I ordered medication including trazodone  for insomnia  Reevaluation of the patient after these medicines showed that the patient improved I have reviewed the patients home medicines and have made adjustments as needed   Problem List / ED Course:  Patient presents to the ED for psychiatric evaluation. He reports last taking Haldol about 2 months ago and since then experiencing auditory hallucinations that threaten to kill him. He denies any suicidal ideation or homicidal ideation at this time. Patient's mother is concerned that patient is not likely taking any of his medications at this time given his current mental state and because she has found most of his pill bottles full recently. Patient denies any chest pain, shortness of breath, abdominal pain. At this time, I believe patient would benefit from TTS consult for potential admission or at least further evaluation and adjustments of medications. Patient is medically cleared at this time.  Final Clinical Impression(s) / ED Diagnoses Final diagnoses:  None    Rx / DC Orders ED Discharge Orders     None         Luvenia Heller, PA-C 12/17/22 2310    Cristie Hem, MD 12/18/22 1242

## 2022-12-17 NOTE — ED Triage Notes (Signed)
Pt placed in hospital attire. Pt gave clothes and belongings to his mother to take home. Pt was wanded by security.

## 2022-12-17 NOTE — ED Triage Notes (Signed)
Pt presents with c/o hearing voices. Denies command hallucinations. Pt states she voices are telling him they want his money. Denies SI/HI. Pt states he used to take Haldol, but he stopped because he didn't like the way it made him feel. He states he told is doctor, but they did not place him on any new medication. Pt in triage with his mother.

## 2022-12-17 NOTE — ED Notes (Signed)
Patient is dressed out in appropriate hospital [facility] attire.   Patient is calm and cooperative at this time.   Patients mother at bedside.   Patients mother took patients belongings to car before coming back to sit with him.    Primary nurse notified.

## 2022-12-18 ENCOUNTER — Ambulatory Visit (INDEPENDENT_AMBULATORY_CARE_PROVIDER_SITE_OTHER)
Admission: EM | Admit: 2022-12-18 | Discharge: 2022-12-19 | Disposition: A | Discharge status: Home / Self Care | Payer: Medicare HMO | Source: Home / Self Care

## 2022-12-18 ENCOUNTER — Encounter (HOSPITAL_COMMUNITY): Payer: Self-pay | Admitting: Family

## 2022-12-18 DIAGNOSIS — F419 Anxiety disorder, unspecified: Secondary | ICD-10-CM | POA: Insufficient documentation

## 2022-12-18 DIAGNOSIS — I10 Essential (primary) hypertension: Secondary | ICD-10-CM | POA: Diagnosis not present

## 2022-12-18 DIAGNOSIS — Z1152 Encounter for screening for COVID-19: Secondary | ICD-10-CM | POA: Insufficient documentation

## 2022-12-18 DIAGNOSIS — Z91148 Patient's other noncompliance with medication regimen for other reason: Secondary | ICD-10-CM | POA: Diagnosis not present

## 2022-12-18 DIAGNOSIS — F209 Schizophrenia, unspecified: Secondary | ICD-10-CM | POA: Diagnosis not present

## 2022-12-18 DIAGNOSIS — F259 Schizoaffective disorder, unspecified: Secondary | ICD-10-CM | POA: Diagnosis not present

## 2022-12-18 DIAGNOSIS — Z79899 Other long term (current) drug therapy: Secondary | ICD-10-CM | POA: Insufficient documentation

## 2022-12-18 DIAGNOSIS — F251 Schizoaffective disorder, depressive type: Secondary | ICD-10-CM

## 2022-12-18 DIAGNOSIS — R440 Auditory hallucinations: Secondary | ICD-10-CM | POA: Insufficient documentation

## 2022-12-18 DIAGNOSIS — F1910 Other psychoactive substance abuse, uncomplicated: Secondary | ICD-10-CM | POA: Insufficient documentation

## 2022-12-18 LAB — RAPID URINE DRUG SCREEN, HOSP PERFORMED
Amphetamines: NOT DETECTED
Barbiturates: NOT DETECTED
Benzodiazepines: POSITIVE — AB
Cocaine: NOT DETECTED
Opiates: POSITIVE — AB
Tetrahydrocannabinol: NOT DETECTED

## 2022-12-18 LAB — SARS CORONAVIRUS 2 BY RT PCR: SARS Coronavirus 2 by RT PCR: NEGATIVE

## 2022-12-18 LAB — POC SARS CORONAVIRUS 2 AG: SARSCOV2ONAVIRUS 2 AG: NEGATIVE

## 2022-12-18 MED ORDER — HYDROXYZINE HCL 25 MG PO TABS
50.0000 mg | ORAL_TABLET | Freq: Once | ORAL | Status: AC
  Administered 2022-12-18: 50 mg via ORAL
  Filled 2022-12-18: qty 2

## 2022-12-18 MED ORDER — OLANZAPINE 5 MG PO TABS
10.0000 mg | ORAL_TABLET | Freq: Once | ORAL | Status: AC
  Administered 2022-12-18: 10 mg via ORAL
  Filled 2022-12-18: qty 2

## 2022-12-18 MED ORDER — ACETAMINOPHEN 325 MG PO TABS: 650.0000 mg | ORAL_TABLET | Freq: Four times a day (QID) | ORAL | Status: DC | PRN

## 2022-12-18 MED ORDER — MAGNESIUM HYDROXIDE 400 MG/5ML PO SUSP: 30.0000 mL | Freq: Every day | ORAL | Status: DC | PRN

## 2022-12-18 MED ORDER — ALUM & MAG HYDROXIDE-SIMETH 200-200-20 MG/5ML PO SUSP: 30.0000 mL | ORAL | Status: DC | PRN

## 2022-12-18 MED ORDER — CLONAZEPAM 0.5 MG PO TABS
1.0000 mg | ORAL_TABLET | Freq: Once | ORAL | Status: AC
  Administered 2022-12-18: 1 mg via ORAL
  Filled 2022-12-18: qty 2

## 2022-12-18 MED ORDER — ZIPRASIDONE MESYLATE 20 MG IM SOLR: 20.0000 mg | INTRAMUSCULAR | Status: DC | PRN

## 2022-12-18 MED ORDER — LORAZEPAM 1 MG PO TABS: 1.0000 mg | ORAL_TABLET | ORAL | Status: DC | PRN

## 2022-12-18 MED ORDER — OLANZAPINE 10 MG PO TBDP: 10.0000 mg | ORAL_TABLET | Freq: Three times a day (TID) | ORAL | Status: DC | PRN

## 2022-12-18 NOTE — ED Notes (Signed)
Pt has a atarax due that was ordered    Mom asking questions and not sure if she wants him to take it.  States her daughter has reactions from this drug.  Explained to her that Dr has ordered it

## 2022-12-18 NOTE — ED Notes (Signed)
TTS at this time. 

## 2022-12-18 NOTE — ED Notes (Signed)
Pt moved to room out of hall. Mother upset that pt's nurse shift was over without notifying her  explain to her that we give shift report off to each other

## 2022-12-18 NOTE — ED Notes (Signed)
Pt verbalized understanding of discharge instructions. Opportunity for questions provided.  

## 2022-12-18 NOTE — ED Notes (Signed)
Pt pacing in the room.  Unable to stay still.  Mom upset and thinks the Atarax did it. She questioning if atarax does this to people.  Explained interactions of atarax.  Explained to her that I need to talk with Elisaul and involve him in his care.  I showed him how to turned TV on with remote.  Ask him if he would like a pill to help calm his anxiety and said "Yes"  Pt does have Xanax ordered. Water given to pt.

## 2022-12-18 NOTE — ED Notes (Addendum)
Pt found to be excessively sweaty.  Sitter reports Pt has been running or marching in place all morning.  Vitals rechecked and HR noted to be in 130s.  Pt assessed for withdrawal and denies drug/alcohol use.  EDP notified.

## 2022-12-18 NOTE — ED Notes (Signed)
Pts mother came to nurses station demanding to get pt into a room and out of hall. Stating "if we are paying for this hospital bill we should be in a room and not in the hallway" This RN explained to pts mother that rooms are first used for pts needing acute medical care and if a room becomes available we would try to accommodate her request.

## 2022-12-18 NOTE — ED Notes (Addendum)
Security ask mother to leave since pt is a [facility] pt and visitors are not allowed all night and over night,.  She became upset and refused to leave.

## 2022-12-18 NOTE — ED Notes (Signed)
Pt mother informed of visitor policy of [facility] patients by security. Pt mother refusing to leave. [employer] called to beside. Pts mother walked out with [employer] yelling "y'all are just pissed that I was pointing out what you all were doing wrong".

## 2022-12-18 NOTE — Consult Note (Signed)
Telepsych Consultation   Reason for Consult:  Command auditory hallucinations Referring Physician:  Dr Ashok Cordia Location of Patient: [facility] Location of Provider: [place]  Patient Identification: Richard Benitez MRN:  [id number] Principal Diagnosis: Anxiety Diagnosis:  Principal Problem:   Anxiety   Total Time spent with patient: 30 minutes  Subjective:   Richard Benitez is a 47 y.o. male patient admitted with auditory hallucinations.  Patient states "my mom brought me in because I was hearing voices."  Patient prefers to be called "Richard Benitez."  HPI: Patient is reassessed by this nurse practitioner virtually, via telepsychiatry monitor.  He is seated on hospital stretcher, no apparent distress.  He is alert and oriented to self and situation.  He is not oriented to time currently.  Patient presents with euthymic mood, congruent affect.  Patient discussed with psychiatrist, Dr. Hampton Abbot and chart reviewed on 12/18/2022.  Patient denies auditory and visual hallucinations currently.  He denies command hallucinations.  He denies suicidal and homicidal ideations.  He denies history of suicide attempts, denies history of nonsuicidal self-harm behavior.  He easily contracts verbally for safety with this Probation officer.  Richard Benitez reports "I have a history of panic attacks and back pain, I see Dr. Chucky May."  Patient reports he is compliant with medications.  He is unable to recall specific medications aside from alprazolam.  Patient shares that his mother assists him with medication management typically.  Patient resides in [place] with his parents.  He receives disability income.  He denies alcohol and substance use.  He endorses average sleep and appetite.  Patient offered support and encouragement.  He gives verbal consent to speak with his mother, Carman Flach phone number [phone].  Spoke with patient's mother who reports for 2 days prior to  arrival "Richard Benitez is not in his best shape, he was looking a wild eyed and complaining about not sleeping, his memory has not been good."  Per patient's mother patient has recently been managing his medications on a weekly basis.  Patient's parents have secured medications as patient has used more than prescribed in the past. Patient's mother counted medications this week and realized that patient had used 5 more doses than prescribed of alprazolam 1 mg.  Alprazolam 1 mg 3 times daily as needed/anxiety currently prescribed by outpatient psychiatrist Dr. Chucky May. He has used 10 additional doses of morphine 60 mEq extended release.  This medication is prescribed by primary care provider Dr. Dagmar Hait.  Morphine is prescribed every 12 hours.  He has used 14 additional doses of cyclobenzaprine, this medication is prescribed 3 times daily as needed/muscle spasm.  He has also used 3 additional doses of hydrocodone 10-325.  Hydrocodone is prescribed 3 times daily as needed/back pain. Patient's mother verbalizes that he has not taken medications including metoprolol, trazodone, haloperidol and rosuvastatin for the past 3 weeks.  Patient reported he stopped haloperidol because did not like the way it made him feel.  Current plan includes patient's mother managing his medication each day.  Patient's mother will follow-up with established outpatient psychiatrist and primary care provider to review patient's use of medications and additional medication doses and required supervision. She will consider follow up with individual counseling.   Per patient's mother 1 previous similar episode.  6 years ago patient found unconscious after receiving a medication refill.  Per patient's mother "believe he should take enough of the medications to catch up after he ran out of his medicines early."  No history of suicide attempts.  Per patient's mother episode 6 years ago was not suicide attempt.  Patient's mother is not legal  guardian however patient reports he depends on his mother to help with making decisions.  Per patient's mother patient "has been diagnosed with ADHD and did require some special classes including speech therapy when in school."  Patient's mother reports concern that patient psychiatrist does not "get to the bottom of things."  Patient meets with his outpatient psychiatrist, Dr. Chucky May, "on the phone for approximately 5 minutes."  He meets with her in her office once per year.  Reviewed plan to reinitiate medications including haloperidol and trazodone, patient has been stable on this combination for approximately 1 year.  Patient's mother confirms all weapons in home will be secured prior to patient's return home.  No safety concerns reported.   Patient and family are educated and verbalize understanding of mental health resources and other crisis services in the community. They are instructed to call 911 and present to the nearest emergency room should patient experience any suicidal/homicidal ideation, auditory/visual/hallucinations, or detrimental worsening of mental health condition.     Past Psychiatric History: Anxiety, depression, substance abuse, bipolar II disorder, accidental drug overdose, schizoaffective disorder  Risk to Self:   Denies Risk to Others:   Denies Prior Inpatient Therapy:   none reported Prior Outpatient Therapy:   none reported  Past Medical History:  Past Medical History:  Diagnosis Date   Anxiety    Back pain, chronic    Depression    Schizoaffective disorder    Substance abuse    Tobacco abuse    History reviewed. No pertinent surgical history. Family History: History reviewed. No pertinent family history. Family Psychiatric  History:  none reported Social History:  Social History   Substance and Sexual Activity  Alcohol Use Not Currently   Comment: former abuse     Social History   Substance and Sexual Activity  Drug Use Yes   Types:  Marijuana   Comment: 3 weeks ago, hx of polysub. dependence    Social History   Socioeconomic History   Marital status: Single    Spouse name: Not on file   Number of children: Not on file   Years of education: Not on file   Highest education level: Not on file  Occupational History   Not on file  Tobacco Use   Smoking status: Every Day    Packs/day: .5    Types: Cigarettes   Smokeless tobacco: Never  Vaping Use   Vaping Use: Never used  Substance and Sexual Activity   Alcohol use: Not Currently    Comment: former abuse   Drug use: Yes    Types: Marijuana    Comment: 3 weeks ago, hx of polysub. dependence   Sexual activity: Not Currently  Other Topics Concern   Not on file  Social History Narrative   Not on file   Social Determinants of Health   Financial Resource Strain: Not on file  Food Insecurity: Not on file  Transportation Needs: Not on file  Physical Activity: Not on file  Stress: Not on file  Social Connections: Not on file   Additional Social History:    Allergies:  No Known Allergies  Labs:  Results for orders placed or performed during the hospital encounter of 12/17/22 (from the past 48 hour(s))  Comprehensive metabolic panel     Status: Abnormal   Collection Time: 12/17/22  6:24 PM  Result  Value Ref Range   Sodium 138 135 - 145 mmol/L   Potassium 4.4 3.5 - 5.1 mmol/L   Chloride 101 98 - 111 mmol/L   CO2 27 22 - 32 mmol/L   Glucose, Bld 107 (H) 70 - 99 mg/dL    Comment: Glucose reference range applies only to samples taken after fasting for at least 8 hours.   BUN 16 6 - 20 mg/dL   Creatinine, Ser 1.02 0.61 - 1.24 mg/dL   Calcium 9.7 8.9 - 10.3 mg/dL   Total Protein 7.8 6.5 - 8.1 g/dL   Albumin 4.6 3.5 - 5.0 g/dL   AST 15 15 - 41 U/L   ALT 31 0 - 44 U/L   Alkaline Phosphatase 101 38 - 126 U/L   Total Bilirubin 0.5 0.3 - 1.2 mg/dL   GFR, Estimated >60 >60 mL/min    Comment: (NOTE) Calculated using the CKD-EPI Creatinine Equation (2021)     Anion gap 10 5 - 15    Comment: Performed at [facility], [address], [place], [place] [address]  Ethanol     Status: None   Collection Time: 12/17/22  6:24 PM  Result Value Ref Range   Alcohol, Ethyl (B) <10 <10 mg/dL    Comment: (NOTE) Lowest detectable limit for serum alcohol is 10 mg/dL.  For medical purposes only. Performed at [facility], [address] [place], [place] [id number]   Salicylate level     Status: Abnormal   Collection Time: 12/17/22  6:24 PM  Result Value Ref Range   Salicylate Lvl [id number] (L) 7.0 - 30.0 mg/dL    Comment: Performed at [facility], [address] [place], [place] [address]  Acetaminophen level     Status: Abnormal   Collection Time: 12/17/22  6:24 PM  Result Value Ref Range   Acetaminophen (Tylenol), Serum <10 (L) 10 - 30 ug/mL    Comment: (NOTE) Therapeutic concentrations vary significantly. A range of 10-30 ug/mL  may be an effective concentration for many patients. However, some  are best treated at concentrations outside of this range. Acetaminophen concentrations >150 ug/mL at 4 hours after ingestion  and >50 ug/mL at 12 hours after ingestion are often associated with  toxic reactions.  Performed at [facility], [address] [place], [place] [address]   cbc     Status: Abnormal   Collection Time: 12/17/22  6:24 PM  Result Value Ref Range   WBC 11.9 (H) 4.0 - 10.5 K/uL   RBC 5.02 4.22 - 5.81 MIL/uL   Hemoglobin 15.3 13.0 - 17.0 g/dL   HCT 45.9 39.0 - 52.0 %   MCV 91.4 80.0 - 100.0 fL   MCH 30.5 26.0 - 34.0 pg   MCHC 33.3 30.0 - 36.0 g/dL   RDW 12.9 11.5 - 15.5 %   Platelets 280 150 - 400 K/uL   nRBC 0.0 0.0 - 0.2 %    Comment: Performed at [facility], [address], [place], [place] [address]  Rapid urine drug screen (hospital performed)     Status: Abnormal   Collection Time: 12/18/22  2:53 AM  Result Value Ref Range   Opiates POSITIVE (A) NONE DETECTED   Cocaine NONE DETECTED NONE DETECTED   Benzodiazepines  POSITIVE (A) NONE DETECTED   Amphetamines NONE DETECTED NONE DETECTED   Tetrahydrocannabinol NONE DETECTED NONE DETECTED   Barbiturates NONE DETECTED NONE DETECTED    Comment: (NOTE) DRUG SCREEN FOR MEDICAL PURPOSES ONLY.  IF CONFIRMATION IS NEEDED FOR  ANY PURPOSE, NOTIFY LAB WITHIN 5 DAYS.  LOWEST DETECTABLE LIMITS FOR URINE DRUG SCREEN Drug Class                     Cutoff (ng/mL) Amphetamine and metabolites    1000 Barbiturate and metabolites    200 Benzodiazepine                 200 Opiates and metabolites        300 Cocaine and metabolites        300 THC                            50 Performed at [facility], [address] [place], [place] [address]     Medications:  Current Facility-Administered Medications  Medication Dose Route Frequency Provider Last Rate Last Admin   ALPRAZolam Duanne Moron) tablet 1 mg  1 mg Oral TID PRN Lourdes Sledge A, PA-C   1 mg at 12/18/22 0529   cyclobenzaprine (FLEXERIL) tablet 10 mg  10 mg Oral TID Lourdes Sledge A, PA-C   10 mg at 12/18/22 [id number]   metoprolol succinate (TOPROL-XL) 24 hr tablet 25 mg  25 mg Oral Daily Lourdes Sledge A, PA-C   25 mg at 12/18/22 [id number]   rosuvastatin (CRESTOR) tablet 5 mg  5 mg Oral Daily Lourdes Sledge A, PA-C   5 mg at 12/18/22 [id number]   tamsulosin (FLOMAX) capsule 0.4 mg  0.4 mg Oral Daily Lourdes Sledge A, PA-C   0.4 mg at 12/18/22 [id number]   traZODone (DESYREL) tablet 100 mg  100 mg Oral QHS Luvenia Heller, PA-C       Current Outpatient Medications  Medication Sig Dispense Refill   ALPRAZolam (XANAX) 1 MG tablet Take 1 mg by mouth 3 (three) times daily as needed.     chlorhexidine (PERIDEX) 0.12 % solution 2 (two) times daily.     clonazePAM (KLONOPIN) 1 MG tablet Take 1 mg by mouth 3 (three) times daily.     cyclobenzaprine (FLEXERIL) 10 MG tablet Take 2 tablets (20 mg total) by mouth 3 (three) times daily as needed for muscle spasms. (Patient taking differently: Take 10 mg by mouth 3 (three) times daily as needed for  muscle spasms.) 30 tablet 0   HYDROcodone-acetaminophen (NORCO) 10-325 MG per tablet Take 1-2 tablets by mouth 3 (three) times daily as needed for moderate pain or severe pain.      metoprolol succinate (TOPROL-XL) 25 MG 24 hr tablet Take 25 mg by mouth daily.     morphine (MS CONTIN) 60 MG 12 hr tablet Take 60 mg by mouth every 12 (twelve) hours.     tamsulosin (FLOMAX) 0.4 MG CAPS capsule Take 1 capsule (0.4 mg total) by mouth daily. 30 capsule 0   rosuvastatin (CRESTOR) 5 MG tablet Take 5 mg by mouth daily.     traZODone (DESYREL) 100 MG tablet Take 200 mg by mouth at bedtime. (Patient not taking: Reported on 12/17/2022)      Musculoskeletal: Strength & Muscle Tone: within normal limits Gait & Station: normal Patient leans: N/A          Psychiatric Specialty Exam:  Presentation  General Appearance: Appropriate for Environment; Casual  Eye Contact:Good  Speech:Clear and Coherent; Normal Rate  Speech Volume:Normal  Handedness:Right   Mood and Affect  Mood:Euthymic  Affect:Appropriate; Congruent   Thought Process  Thought Processes:Coherent; Goal Directed; Linear  Descriptions of Associations:Intact  Orientation:Partial  Thought Content:Logical; WDL  History of Schizophrenia/Schizoaffective disorder:Yes  Duration of Psychotic Symptoms:N/A  Hallucinations:Hallucinations: None  Ideas of Reference:None  Suicidal Thoughts:Suicidal Thoughts: No  Homicidal Thoughts:Homicidal Thoughts: No   Sensorium  Memory:Immediate Fair  Judgment:Intact  Insight:Present   Executive Functions  Concentration:Good  Attention Span:Good  [place]   Psychomotor Activity  Psychomotor Activity:Psychomotor Activity: Normal   Assets  Assets:Communication Skills; Housing; Catering manager; Resilience; Social Support   Sleep  Sleep:Sleep: Poor    Physical Exam: Physical Exam Vitals and nursing note  reviewed.  Constitutional:      Appearance: Normal appearance. He is normal weight.  HENT:     Head: Normocephalic and atraumatic.     Nose: Nose normal.  Cardiovascular:     Rate and Rhythm: Tachycardia present.  Pulmonary:     Effort: Pulmonary effort is normal.  Musculoskeletal:        General: Normal range of motion.     Cervical back: Normal range of motion.  Neurological:     Mental Status: He is alert.  Psychiatric:        Attention and Perception: Attention and perception normal.        Mood and Affect: Mood and affect normal.        Speech: Speech normal.        Behavior: Behavior normal. Behavior is cooperative.        Thought Content: Thought content normal.    Review of Systems  Constitutional: Negative.   HENT: Negative.    Eyes: Negative.   Respiratory: Negative.    Cardiovascular: Negative.   Gastrointestinal: Negative.   Genitourinary: Negative.   Musculoskeletal: Negative.   Skin: Negative.   Neurological: Negative.   Psychiatric/Behavioral:  The patient has insomnia.    Blood pressure (!) 151/98, pulse (!) 104, temperature 98.7 F (37.1 C), temperature source Oral, resp. rate 18, height 6\' 4"  (1.93 m), weight 98.7 kg, SpO2 99 %. Body mass index is 26.49 kg/m.  Treatment Plan Summary: Plan follow up with established outpatient psychiatry.  Seen by Dr Chucky May. Patient cleared by psychiatry.  Will follow-up with established outpatient team.  Recommend consider individual counseling options for outpatient.  Continue current medications including: Haloperidol 2 mg nightly Trazodone 100 mg nightly   Disposition: No evidence of imminent risk to self or others at present.   Patient does not meet criteria for psychiatric inpatient admission. Supportive therapy provided about ongoing stressors. Discussed crisis plan, support from social network, calling 911, coming to the Emergency Department, and calling Suicide Hotline.  This service was provided  via telemedicine using a 2-way, interactive audio and video technology.  Names of all persons participating in this telemedicine service and their role in this encounter. Name: Javi "Richard Benitez" Delbert Phenix Role: Patient  Name: Brock Bad Role: Patient's mother  Name: Beatriz Stallion  Role: NP  Name: Dr Hampton Abbot Role: Psychiatrist  Name: Dr Ashok Cordia Role: Psychiatrist    Lucky Rathke, FNP 12/18/2022 2:10 PM

## 2022-12-18 NOTE — ED Notes (Signed)
Pt laying in bed calm and cooperative. No c/o pain or distress. Will continue to monitor for safety 

## 2022-12-18 NOTE — ED Notes (Signed)
Attempted to call Pt's mother and father w/o an answer.

## 2022-12-18 NOTE — ED Notes (Signed)
Pt has continued to relax this afternoon.  Pt informed he is being discharged and is agreeable.

## 2022-12-18 NOTE — ED Notes (Signed)
Pt laying quietly in bed.  Pt's Mom with him

## 2022-12-18 NOTE — Discharge Instructions (Signed)
It was our pleasure to provide your ER care today - we hope that you feel better.  Drink plenty of fluids/stay well hydrated. Take your meds as prescribed by your doctor.   Follow up closely with primary care doctor and behavioral health provider in the coming week. Also follow up with primary care doctor regarding your blood pressure that is high today.   Return to ER if symptoms worse, new symptoms, fevers, new/severe pain, chest pain, trouble breathing, or other emergency concern.

## 2022-12-18 NOTE — ED Notes (Signed)
Pt reports he is antsy/restless and took medications w/o incident.  HR and BP decreased once Pt was able to be redirected into bed.

## 2022-12-18 NOTE — ED Notes (Signed)
Pt's niece arrived to transport the Pt home.  Verbalized understanding of discharge paperwork.  No belongings found for Pt in lockers or in room.  It is assumed the Pt's mother took the belongings home.  Pt calm and cooperative at discharge.

## 2022-12-18 NOTE — ED Provider Notes (Signed)
Emergency Medicine Observation Re-evaluation Note  Richard Benitez is a 47 y.o. male, seen on rounds today.  Pt initially presented to the ED for complaints of hx schizoaffective disorder, recent non-compliance w meds (reportedly did not like how haldol made him feel), and resultant acute psychosis and anxiety. Pt has been somewhat restless, marching in place in room.  Physical Exam  BP (!) 196/150 (BP Location: Left Arm)   Pulse (!) 132   Temp 98.1 F (36.7 C) (Oral)   Resp 18   Ht 1.93 m (6\' 4" )   Wt 98.7 kg   SpO2 98%   BMI 26.49 kg/m  Physical Exam General: anxious appearing, marching about room.  Cardiac: tachycardic (pt 'exercising'). Lungs: breathing comfortably Psych: appears restless/hyperactive, and mildly anxious. Does not voice any thoughts of harm to self/others. ?responding to internal stimuli.   ED Course / MDM    I have reviewed the labs performed to date as well as medications administered while in observation.  Recent changes in the last 24 hours include ED obs, reassessment.   Plan  Pt currently noted to be only receiving trazodone, and one dose ativan. Will order dose of zyprexa and klonopin for symptom improvement, and re-consult [place] team for reassessment to include 1. Updated dispo/placement plan and 2. BH medication recommendations.   Dispo per [facility] team.     Lajean Saver, MD 12/18/22 [phone]

## 2022-12-18 NOTE — ED Notes (Signed)
Pt continues to rest in bed.  Pt took medications w/o incident.  Tele Psych cart at bedside.

## 2022-12-18 NOTE — ED Notes (Signed)
Pt mother closed curtains in room.  Explained to her that the curtains must remain open at all times so sitter has direct view.

## 2022-12-18 NOTE — Progress Notes (Signed)
   12/18/22 2042  [place] Triage Screening (Walk-ins at [facility] only)  How Did You Hear About [place]? Family/Friend  What Is the Reason for Your Visit/Call Today? Patient is a 47 year old single male who presents voluntarily to [facility], accompanied by his mother Fillip Boyadjian [phone]. Patient reports he is hearing voices stating, "they are coming to kill me." Patient states he has been hearing the voices for 8 years. Patient is unable to provide details on if the voices have come and go, continuing to state he has been hearing them for years. Patient reports he also saw a white cloud in his bathroom today. Patient endorses feelings of depression and anxiety; however, he is unable to name any stressors or provide symptoms. Patient is prescribed Xanax and sees Dr. Chucky May for medication management. Patient denies substance use. Patient states he lives with both his parents and has access to rifles. Patient denies SI/HI. He reports no history of self-harm. Patient can verbally contract for safety at this time. Patient is urgent.  How Long Has This Been Causing You Problems? > than 6 months  Have You Recently Had Any Thoughts About Hurting Yourself? No  Are You Planning to Commit Suicide/Harm Yourself At This time? No  Have you Recently Had Thoughts About [place]? No  Are You Planning To Harm Someone At This Time? No  Explanation: N/A  Are you currently experiencing any auditory, visual or other hallucinations? Yes  Please explain the hallucinations you are currently experiencing: Patient is hearing voice stating "they are coming to kill me."  Have You Used Any Alcohol or Drugs in the Past 24 Hours? No  What Did You Use and How Much? N/A  Do you have any current medical co-morbidities that require immediate attention? No  Clinician description of patient physical appearance/behavior: Patient is dressed in hospital scrubs. Patient is alert and oriented to person, place and situation. Patient  has delays in conversation at times. Patient is calm and cooperative throughout the assessment.  What Do You Feel Would Help You the Most Today? Treatment for Depression or other mood problem;Medication(s)  If access to [facility] was not available, would you have sought care in the Emergency Department? Yes  Determination of Need Urgent (48 hours)  Options For Referral Medication Management

## 2022-12-18 NOTE — ED Provider Notes (Signed)
Emergency Medicine Observation Re-evaluation Note  Richard Benitez is a 47 y.o. male, seen on rounds today.  Pt initially presented to the ED for complaints of Psychiatric Evaluation Patient has been evaluated by [facility] team who has psych cleared for discharge, indicates pt/parent has adequate of his meds at home, and has outpatient f/u/provider.   For past several hours, pt has remained calm and cooperative. Pt currently denies any c/o, indicates feels improved, and ready to go home.   Physical Exam  BP (!) 151/98 (BP Location: Right Arm)   Pulse (!) 104   Temp 98.7 F (37.1 C) (Oral)   Resp 18   Ht 1.93 m (6\' 4" )   Wt 98.7 kg   SpO2 99%   BMI 26.49 kg/m  Physical Exam General: calm, no distress.  Cardiac: regular rate.  Lungs: breathing comfortably. Psych: alert, calm, content. Answers questions appropriately. Pt has normal mood and affect. Does not appear acutely depressed or despondent. Much calmer, less anxious, as compared to early this AM.  No thoughts of harm to self or others. No delusions/hallucinations noted.   ED Course / MDM   I have reviewed the labs performed to date as well as medications administered while in observation.  Recent changes in the last 24 hours include ED obs, reassessment.   Plan  BH team/ T [name] has reassessed and indicates psych clear for d/c, has spoken w parent and patient. Indicates has adequate meds at home, plan is for parent to assist with administering to assure compliance, and recs outpatient f/u with [name].   Pt currently appears stable for d/c per [facility] plan.   Return precautions provided.       Lajean Saver, MD 12/18/22 1500

## 2022-12-18 NOTE — ED Notes (Signed)
Attempted to call Pt's mother w/o answer.  HIPAA appropriate VM left.

## 2022-12-18 NOTE — ED Notes (Signed)
Pt unable to provide urine sample at this time 

## 2022-12-18 NOTE — ED Notes (Signed)
Pt's niece called for an update.  Sts she will contact the Pt's mother about coming to get the Pt.

## 2022-12-18 NOTE — ED Notes (Addendum)
Pt pacing in room.

## 2022-12-18 NOTE — ED Provider Notes (Signed)
[facility] Continuous Assessment Admission H&P  Date: 12/18/22 Patient Name: Piersen Hogenson MRN: [id number] Chief Complaint: hearing voice "tell me they going to kill me"  Diagnoses:  Final diagnoses:  Auditory hallucination  Schizophrenia, unspecified type    HPI: Tyme Falsetti,  47 y/o male with a history of schizoaffective disorder, substance abuse, depression, and anxiety presented to [facility] accompanied by his mother.  Patient was just seen today and discharge however patient is back.  According to patient he is hearing voices that are telling him that they are going to kill him.  Patient lives at home with his mother and father.  Patient is unemployed.  Please see assessment notes from ER visit early on today: Per patient's mother patient has recently been managing his medications on a weekly basis.  Patient's parents have secured medications as patient has used more than prescribed in the past. Patient's mother counted medications this week and realized that patient had used 5 more doses than prescribed of alprazolam 1 mg.  Alprazolam 1 mg 3 times daily as needed/anxiety currently prescribed by outpatient psychiatrist Dr. Chucky May. He has used 10 additional doses of morphine 60 mEq extended release.  This medication is prescribed by primary care provider Dr. Dagmar Hait.  Morphine is prescribed every 12 hours.  He has used 14 additional doses of cyclobenzaprine, this medication is prescribed 3 times daily as needed/muscle spasm.  He has also used 3 additional doses of hydrocodone 10-325.  Hydrocodone is prescribed 3 times daily as needed/back pain. Patient's mother verbalizes that he has not taken medications including metoprolol, trazodone, haloperidol and rosuvastatin for the past 3 weeks.  Patient reported he stopped haloperidol because did not like the way it made him feel.   TTS notes: Delano Artuso is a 47 year old single male who presents voluntarily to [facility], accompanied by his mother  Moss Seurer [phone]. Patient was psych cleared and discharged from [facility] earlier today. Patient reports he is hearing voices stating, "they are coming to kill me." Patient states he has been hearing the voices for 8 years. Patient is unable to provide details on if the voices have come and go, or exactly when they started, continuing to state he has been hearing them for years. Patient reports he also saw a white cloud in his bathroom today. Patient endorses feelings of depression and anxiety; however, he is unable to name any stressors or provide symptoms. Patient is prescribed Xanax and sees Dr. Chucky May for medication management. Patient denies substance use. Patient states he lives with both his parents and has access to rifles. Patient denies SI/HI. He reports no history of self-harm. Patient can verbally contract for safety at this time  Face-to-face observation of patient, patient is alert and oriented, maintaining eye contact.  Patient answers questions appropriately.  Per the patient he is hearing voices and the voices telling him that they are going to kill him.  According to patient his mom brought him back because of hallucination.  Patient denies current SI, HI, patient denies alcohol use.  A review of patient history shows patient does have a substance abuse problem.    Recommend observation  Total Time spent with patient: 20 minutes  Musculoskeletal  Strength & Muscle Tone: within normal limits Gait & Station: normal Patient leans: N/A  Psychiatric Specialty Exam  Presentation General Appearance:  Casual  Eye Contact: Good  Speech: Clear and Coherent  Speech Volume: Normal  Handedness: Right   Mood and Affect  Mood:  Euthymic  Affect: Appropriate   Thought Process  Thought Processes: Coherent  Descriptions of Associations:Intact  Orientation:Full (Time, Place and Person)  Thought Content:WDL  Diagnosis of Schizophrenia or Schizoaffective  disorder in past: Yes  Duration of Psychotic Symptoms: Greater than six months  Hallucinations:Hallucinations: Auditory Description of Auditory Hallucinations: voices telling him they are going to kill him  Ideas of Reference:Paranoia  Suicidal Thoughts:Suicidal Thoughts: No  Homicidal Thoughts:Homicidal Thoughts: No   Sensorium  Memory: Immediate Fair  Judgment: Fair  Insight: Fair   Community education officer  Concentration: Fair  Attention Span: Fair  Recall: [employer] of Knowledge: Fair  Language: Fair   Psychomotor Activity  Psychomotor Activity: Psychomotor Activity: Normal   Assets  Assets: Desire for Improvement; Resilience   Sleep  Sleep: Sleep: Fair   Nutritional Assessment (For OBS and FBC admissions only) Has the patient had a weight loss or gain of 10 pounds or more in the last 3 months?: No Has the patient had a decrease in food intake/or appetite?: No Does the patient have dental problems?: No Does the patient have eating habits or behaviors that may be indicators of an eating disorder including binging or inducing vomiting?: No Has the patient recently lost weight without trying?: 0 Has the patient been eating poorly because of a decreased appetite?: 0 Malnutrition Screening Tool Score: 0    Physical Exam HENT:     Head: Normocephalic.     Nose: Nose normal.  Cardiovascular:     Rate and Rhythm: Normal rate.  Pulmonary:     Effort: Pulmonary effort is normal.  Musculoskeletal:        General: Normal range of motion.     Cervical back: Normal range of motion.  Neurological:     General: No focal deficit present.     Mental Status: He is alert.  Psychiatric:        Mood and Affect: Mood normal.        Behavior: Behavior normal.        Thought Content: Thought content normal.        Judgment: Judgment normal.    Review of Systems  Constitutional: Negative.   HENT: Negative.    Eyes: Negative.   Respiratory: Negative.     Cardiovascular: Negative.   Gastrointestinal: Negative.   Genitourinary: Negative.   Musculoskeletal: Negative.   Skin: Negative.   Neurological: Negative.   Psychiatric/Behavioral:  Positive for hallucinations and substance abuse. The patient is nervous/anxious.     Blood pressure (!) 150/89, pulse (!) 110, temperature 99.6 F (37.6 C), temperature source Oral, resp. rate 18, SpO2 100 %. There is no height or weight on file to calculate BMI.  Past Psychiatric History: Schizoaffective disorder, anxiety, substance abuse  Is the patient at risk to self? No  Has the patient been a risk to self in the past 6 months? No .    Has the patient been a risk to self within the distant past? No   Is the patient a risk to others? No   Has the patient been a risk to others in the past 6 months? No   Has the patient been a risk to others within the distant past? No   Past Medical History: See chart  Family History: Unknown  Social History: Tobacco  Last Labs:  Admission on 12/17/2022, Discharged on 12/18/2022  Component Date Value Ref Range Status   Sodium 12/17/2022 138  135 - 145 mmol/L Final   Potassium 12/17/2022 4.4  3.5 - 5.1 mmol/L Final   Chloride 12/17/2022 101  98 - 111 mmol/L Final   CO2 12/17/2022 27  22 - 32 mmol/L Final   Glucose, Bld 12/17/2022 107 (H)  70 - 99 mg/dL Final   Glucose reference range applies only to samples taken after fasting for at least 8 hours.   BUN 12/17/2022 16  6 - 20 mg/dL Final   Creatinine, Ser 12/17/2022 1.02  0.61 - 1.24 mg/dL Final   Calcium 12/17/2022 9.7  8.9 - 10.3 mg/dL Final   Total Protein 12/17/2022 7.8  6.5 - 8.1 g/dL Final   Albumin 12/17/2022 4.6  3.5 - 5.0 g/dL Final   AST 12/17/2022 15  15 - 41 U/L Final   ALT 12/17/2022 31  0 - 44 U/L Final   Alkaline Phosphatase 12/17/2022 101  38 - 126 U/L Final   Total Bilirubin 12/17/2022 0.5  0.3 - 1.2 mg/dL Final   GFR, Estimated 12/17/2022 >60  >60 mL/min Final   Comment:  (NOTE) Calculated using the CKD-EPI Creatinine Equation (2021)    Anion gap 12/17/2022 10  5 - 15 Final   Performed at [facility], [address], [place], [place] [address]   Alcohol, Ethyl (B) 12/17/2022 <10  <10 mg/dL Final   Comment: (NOTE) Lowest detectable limit for serum alcohol is 10 mg/dL.  For medical purposes only. Performed at [facility], [address], [place], [place] [id number]    Salicylate Lvl [id number] <7.0 (L)  7.0 - 30.0 mg/dL Final   Performed at [facility], [address], [place], [place] [address]   Acetaminophen (Tylenol), Serum 12/17/2022 <10 (L)  10 - 30 ug/mL Final   Comment: (NOTE) Therapeutic concentrations vary significantly. A range of 10-30 ug/mL  may be an effective concentration for many patients. However, some  are best treated at concentrations outside of this range. Acetaminophen concentrations >150 ug/mL at 4 hours after ingestion  and >50 ug/mL at 12 hours after ingestion are often associated with  toxic reactions.  Performed at [facility], [address] [place], [place] [address]    WBC 12/17/2022 11.9 (H)  4.0 - 10.5 K/uL Final   RBC 12/17/2022 5.02  4.22 - 5.81 MIL/uL Final   Hemoglobin 12/17/2022 15.3  13.0 - 17.0 g/dL Final   HCT 12/17/2022 45.9  39.0 - 52.0 % Final   MCV 12/17/2022 91.4  80.0 - 100.0 fL Final   MCH 12/17/2022 30.5  26.0 - 34.0 pg Final   MCHC 12/17/2022 33.3  30.0 - 36.0 g/dL Final   RDW 12/17/2022 12.9  11.5 - 15.5 % Final   Platelets 12/17/2022 280  150 - 400 K/uL Final   nRBC 12/17/2022 0.0  0.0 - 0.2 % Final   Performed at [facility], [address], [place], [place] [address]   Opiates 12/18/2022 POSITIVE (A)  NONE DETECTED Final   Cocaine 12/18/2022 NONE DETECTED  NONE DETECTED Final   Benzodiazepines 12/18/2022 POSITIVE (A)  NONE DETECTED Final   Amphetamines 12/18/2022 NONE DETECTED  NONE DETECTED Final   Tetrahydrocannabinol 12/18/2022 NONE DETECTED  NONE DETECTED Final   Barbiturates 12/18/2022  NONE DETECTED  NONE DETECTED Final   Comment: (NOTE) DRUG SCREEN FOR MEDICAL PURPOSES ONLY.  IF CONFIRMATION IS NEEDED FOR ANY PURPOSE, NOTIFY LAB WITHIN 5 DAYS.  LOWEST DETECTABLE LIMITS FOR URINE DRUG SCREEN Drug Class                     Cutoff (ng/mL)  Amphetamine and metabolites    1000 Barbiturate and metabolites    200 Benzodiazepine                 200 Opiates and metabolites        300 Cocaine and metabolites        300 THC                            50 Performed at [facility], [address], [place], [place] [address]     Allergies: Patient has no known allergies.  Medications:  Facility Ordered Medications  Medication   [COMPLETED] hydrOXYzine (ATARAX) tablet 50 mg   [COMPLETED] OLANZapine (ZYPREXA) tablet 10 mg   [COMPLETED] clonazePAM (KLONOPIN) tablet 1 mg   PTA Medications  Medication Sig   HYDROcodone-acetaminophen (NORCO) 10-325 MG per tablet Take 1-2 tablets by mouth 3 (three) times daily as needed for moderate pain or severe pain.    morphine (MS CONTIN) 60 MG 12 hr tablet Take 60 mg by mouth every 12 (twelve) hours.   clonazePAM (KLONOPIN) 1 MG tablet Take 1 mg by mouth 3 (three) times daily.   cyclobenzaprine (FLEXERIL) 10 MG tablet Take 2 tablets (20 mg total) by mouth 3 (three) times daily as needed for muscle spasms. (Patient taking differently: Take 10 mg by mouth 3 (three) times daily as needed for muscle spasms.)   tamsulosin (FLOMAX) 0.4 MG CAPS capsule Take 1 capsule (0.4 mg total) by mouth daily.   ALPRAZolam (XANAX) 1 MG tablet Take 1 mg by mouth 3 (three) times daily as needed.   chlorhexidine (PERIDEX) 0.12 % solution 2 (two) times daily.   metoprolol succinate (TOPROL-XL) 25 MG 24 hr tablet Take 25 mg by mouth daily.   rosuvastatin (CRESTOR) 5 MG tablet Take 5 mg by mouth daily.   traZODone (DESYREL) 100 MG tablet Take 200 mg by mouth at bedtime. (Patient not taking: Reported on 12/17/2022)    Medical Decision Making  Inpatient  observation    Recommendations  Based on my evaluation the patient appears to have an emergency medical condition for which I recommend the patient be transferred to the emergency department for further evaluation.  Evette Georges, NP 12/18/22  9:15 PM

## 2022-12-18 NOTE — BH Assessment (Signed)
Comprehensive Clinical Assessment (CCA) Note  12/18/2022 Richard Benitez [id number]  Disposition: Evette Georges, NP completed MSE and recommends continuous assessment, to be reassessed tomorrow.  The patient demonstrates the following risk factors for suicide: Chronic risk factors for suicide include: psychiatric disorder of schizophrenia, unspecified type . Acute risk factors for suicide include: N/A. Protective factors for this patient include: positive social support and hope for the future. Considering these factors, the overall suicide risk at this point appears to be none. Patient is not appropriate for outpatient follow up.  Richard Benitez is a 47 year old single male who presents voluntarily to [facility] accompanied by his mother Wayne Siemon [phone]. Patient was psychiatrically cleared and discharged from [facility] ED earlier in the day.  Per chart review, patient has a history of anxiety, depression, and schizoaffective disorder. Patient reports he has been having auditory hallucination "saying they are going to kill me." Patient says he has been having the [place] for 8 years and is unable to provide details on if they are intermittent. Patient also endorses visual hallucinations, stating he saw a white cloud in his bathroom today.  Patient expresses feeling depressed and anxious; however, he is unable to provide symptoms. Patient reports he does not know why he is depressed. Patient denies SI and denies any previous suicide attempts. Patient denies HI. Patient denies current substance use. Patient reports he has access to rifles in his home, however according to Beatriz Stallion, FNP note, his mother confirms all weapons will be secured.  Patient denies any current stressors. Patient lives with both his parents who he states helps take care of him. And are his primary supports. Patient states he has been on disability for at least 5 years, due to ruptured disc in his back.  Patient  denies any history of abuse or trauma. Patient denies legal problems.   Patient reports he currently receives medication management from Dr. Chucky May, who he says he had a session with about 3 months ago. Patient states he is compliant with medications.    Patient is dressed in scrubs, alert and oriented. Patient is slow to respond to questions. Patient has good eye contact and a flat affect. Patient's thought process is relevant. Patient was cooperative throughout the assessment.   Clinician informed patient's mother Owenn Laffin that patient will be staying for observation. Patient's mother reports patient received Ativan at 7p, prior to arrival to [facility]. She requested to be contacted tomorrow for an update.   Chief Complaint:  Chief Complaint  Patient presents with   Hallucinations   Visit Diagnosis: Auditory hallucinations  Schizophrenia, unspecified type   CCA Screening, Triage and Referral (STR)  Patient Reported Information How did you hear about [place]? Family/Friend  What Is the Reason for Your Visit/Call Today? Patient is a 47 year old single male who presents voluntarily to [facility], accompanied by his mother Cordairo Mumby [phone]. Patient reports he is hearing voices stating, "they are coming to kill me." Patient states he has been hearing the voices for 8 years. Patient is unable to provide details on if the voices have come and go, continuing to state he has been hearing them for years. Patient reports he also saw a white cloud in his bathroom today. Patient endorses feelings of depression and anxiety; however, he is unable to name any stressors or provide symptoms. Patient is prescribed Xanax and sees Dr. Chucky May for medication management. Patient denies substance use. Patient states he lives with both his parents and has  access to rifles. Patient denies SI/HI. He reports no history of self-harm. Patient can verbally contract for safety at this time. Patient is  urgent.  How Long Has This Been Causing You Problems? > than 6 months  What Do You Feel Would Help You the Most Today? Treatment for Depression or other mood problem; Medication(s)   Have You Recently Had Any Thoughts About Hurting Yourself? No  Are You Planning to Commit Suicide/Harm Yourself At This time? No   Flowsheet Row ED from 12/18/2022 in [facility] ED from 12/17/2022 in [facility] Emergency Department at [facility] ED from 04/29/2021 in [facility] Emergency Department at [place] No Risk Low Risk No Risk       Have you Recently Had Thoughts About [place]? No  Are You Planning to Harm Someone at This Time? No  Explanation: N/A   Have You Used Any Alcohol or Drugs in the Past 24 Hours? No  What Did You Use and How Much? N/A   Do You Currently Have a Therapist/Psychiatrist? Yes  Name of Therapist/Psychiatrist: Name of Therapist/Psychiatrist: Psychiatrist Dr. Chucky May   Have You Been Recently Discharged From Any Office Practice or Programs? No  Explanation of Discharge From Practice/Program: N/A     CCA Screening Triage Referral Assessment Type of Contact: Face-to-Face  Telemedicine Service Delivery: Telemedicine service delivery: This service was provided via telemedicine using a 2-way, interactive audio and video technology  Is this Initial or Reassessment? Is this Initial or Reassessment?: Initial Assessment  Date Telepsych consult ordered in [facility]:  Date Telepsych consult ordered in [facility]: 12/18/22  Time Telepsych consult ordered in [facility]:  Time Telepsych consult ordered in [facility]: 2142  Location of Assessment: [facility] Assessment Services  Provider Location: [facility] Assessment Services   Collateral Involvement: None   Does Patient Have a [place]? No  Legal Guardian Contact Information: N/A  Copy of Legal Guardianship Form: -- (N/A)  Legal  Guardian Notified of Arrival: -- (N/A)  Legal Guardian Notified of Pending Discharge: -- (N/A)  If Minor and Not Living with Parent(s), Who has Custody? N/A  Is CPS involved or ever been involved? Never  Is APS involved or ever been involved? Never   Patient Determined To Be At Risk for Harm To Self or Others Based on Review of Patient Reported Information or Presenting Complaint? No (Denies SI/HI)  Method: No Plan (Denies SI/HI)  Availability of Means: No access or NA (Denies SI/HI)  Intent: Vague intent or NA (Denies SI/HI)  Notification Required: No need or identified person (Denies SI/HI)  Additional Information for Danger to Others Potential: -- (N/A)  Additional Comments for Danger to Others Potential: N/A  Are There Guns or Other Weapons in Your Home? Yes  Types of Guns/Weapons: Rifles  Are These Weapons Safely Secured?                            No (Per chart review, Mother to ensure secured.)  Who Could Verify You Are Able To Have These Secured: Darroll Olesen (mother) [phone]  Do You Have any Outstanding Charges, Pending Court Dates, Parole/Probation? Patient denies  Contacted To Inform of Risk of Harm To Self or Others: -- (N/A)    Does Patient Present under Involuntary Commitment? No    [place] of Residence: [place]   Patient Currently Receiving the Following Services: Medication Management  Determination of Need: Urgent (48 hours)   Options For Referral: Medication Management; Inpatient Hospitalization     CCA Biopsychosocial Patient Reported Schizophrenia/Schizoaffective Diagnosis in Past: Yes   Strengths: Patient has a supportive family   Mental Health Symptoms Depression:   Sleep (too much or little)   Duration of Depressive symptoms:  Duration of Depressive Symptoms: Less than two weeks   Mania:   None   Anxiety:    Restlessness; Sleep   Psychosis:   Hallucinations   Duration of Psychotic symptoms:  Duration of  Psychotic Symptoms: Less than six months   Trauma:   None   Obsessions:   None   Compulsions:   None   Inattention:   None   Hyperactivity/Impulsivity:   None   Oppositional/Defiant Behaviors:   None   Emotional Irregularity:   None   Other Mood/Personality Symptoms:   N/A    Mental Status Exam Appearance and self-care  Stature:   Tall   Weight:   Average weight   Clothing:   -- (Hospital scrubs)   Grooming:   Normal   Cosmetic use:   None   Posture/gait:   Normal   Motor activity:   Slowed   Sensorium  Attention:   Normal   Concentration:   Normal   Orientation:   X5   Recall/memory:   Normal   Affect and Mood  Affect:   Flat   Mood:   Depressed   Relating  Eye contact:   Normal   Facial expression:   Constricted   Attitude toward examiner:   Cooperative   Thought and Language  Speech flow:  Slow   Thought content:   Appropriate to Mood and Circumstances   Preoccupation:   None   Hallucinations:   Auditory; Visual   Organization:   Loose   Transport planner of Knowledge:   Average   Intelligence:   Average   Abstraction:   Automotive engineer:   Fair   Art therapist:   Distorted   Insight:   Lacking   Decision Making:   Normal   Social Functioning  Social Maturity:   Isolates   Social Judgement:   Normal   Stress  Stressors:   Other (Comment) (Patient denies stressors.)   Coping Ability:   Overwhelmed   Skill Deficits:   Responsibility (Patient's parent's help with care.)   Supports:   Family     Religion: Religion/Spirituality Are You A Religious Person?: Yes What is Your Religious Affiliation?: Christian How Might This Affect Treatment?: N/A  Leisure/Recreation: Leisure / Recreation Do You Have Hobbies?: No  Exercise/Diet: Exercise/Diet Do You Exercise?: No Have You Gained or Lost A Significant Amount of Weight in the Past Six Months?: No Do You  Follow a Special Diet?: No Do You Have Any Trouble Sleeping?: Yes Explanation of Sleeping Difficulties: Patient reports poor sleep.   CCA Employment/Education Employment/Work Situation: Employment / Work Situation Employment Situation: On disability Why is Patient on Disability: Patient reports he has 2 ruptured disc in his back. How Long has Patient Been on Disability: Patient believes 5 years Patient's Job has Been Impacted by Current Illness: No Has Patient ever Been in the [place]?: No  Education: Education Is Patient Currently Attending School?: No Last Grade Completed: 12 Did You Attend College?: No Did You Have An Individualized Education Program (IIEP): Yes Did You Have Any Difficulty At School?: No Patient's Education Has Been Impacted by Current Illness: No   CCA  Family/Childhood History Family and Relationship History: Family history Marital status: Single Does patient have children?: No  Childhood History:  Childhood History By whom was/is the patient raised?: Both parents Did patient suffer any verbal/emotional/physical/sexual abuse as a child?: No Did patient suffer from severe childhood neglect?: No Has patient ever been sexually abused/assaulted/raped as an adolescent or adult?: No Was the patient ever a victim of a crime or a disaster?: No Witnessed domestic violence?: No Has patient been affected by domestic violence as an adult?: No       CCA Substance Use Alcohol/Drug Use: Alcohol / Drug Use Pain Medications: See MAR Prescriptions: See MAR Over the Counter: See MAR History of alcohol / drug use?: No history of alcohol / drug abuse Longest period of sobriety (when/how long): N/A Negative Consequences of Use:  (N/A) Withdrawal Symptoms:  (N/A)                         ASAM's:  Six Dimensions of Multidimensional Assessment  Dimension 1:  Acute Intoxication and/or Withdrawal Potential:      Dimension 2:  Biomedical Conditions and  Complications:      Dimension 3:  Emotional, Behavioral, or Cognitive Conditions and Complications:     Dimension 4:  Readiness to Change:     Dimension 5:  Relapse, Continued use, or Continued Problem Potential:     Dimension 6:  Recovery/Living Environment:     ASAM Severity Score:    ASAM Recommended Level of Treatment:     Substance use Disorder (SUD)    Recommendations for Services/Supports/Treatments:    Discharge Disposition:    DSM5 Diagnoses: Patient Active Problem List   Diagnosis Date Noted   Drug overdose    SIRS (systemic inflammatory response syndrome)    SOB (shortness of breath)    Overdose 10/06/2014   Leukocytosis 10/06/2014   Acute encephalopathy 11/07/2013   Dehydration 11/07/2013   AKI (acute kidney injury) 11/07/2013   Hypokalemia 11/07/2013   Anemia 11/07/2013   Back pain, chronic    Anxiety    Depression    Tobacco abuse    Substance abuse      Referrals to Alternative Service(s): Referred to Alternative Service(s):   Place:   Date:   Time:    Referred to Alternative Service(s):   Place:   Date:   Time:    Referred to Alternative Service(s):   Place:   Date:   Time:    Referred to Alternative Service(s):   Place:   Date:   Time:     Waylan Boga, LCSW

## 2022-12-18 NOTE — ED Notes (Signed)
Per Otila Kluver NP, Pt has been cleared by psych.  Pt's mother and EDP has been made aware.

## 2022-12-18 NOTE — ED Notes (Signed)
Pt received lunch tray 

## 2022-12-19 ENCOUNTER — Encounter (HOSPITAL_COMMUNITY): Payer: Self-pay | Admitting: Emergency Medicine

## 2022-12-19 MED ORDER — OLANZAPINE 5 MG PO TABS: 5.0000 mg | ORAL_TABLET | Freq: Once | ORAL | Status: DC

## 2022-12-19 MED ORDER — OLANZAPINE 5 MG PO TABS
5.0000 mg | ORAL_TABLET | Freq: Every day | ORAL | Status: DC
  Administered 2022-12-19: 5 mg via ORAL
  Filled 2022-12-19: qty 1

## 2022-12-19 MED ORDER — OLANZAPINE 5 MG PO TABS: 5.0000 mg | ORAL_TABLET | Freq: Every day | ORAL | 0 refills | Status: AC

## 2022-12-19 NOTE — ED Notes (Signed)
pt had a pack of cigarettes on his person this am. These were taken from the pt. Pt was taken off the unit and skin search performed.  No other contaband was found.

## 2022-12-19 NOTE — ED Notes (Signed)
Pt sleeping@this time. Breathing even and unlabored. Will continue to monitor for safety 

## 2022-12-19 NOTE — ED Notes (Signed)
Pt is awake and alert.  He has been up to use the bathroom and was offered breakfast.   Awaiting provider for eval.

## 2022-12-19 NOTE — ED Notes (Signed)
Pt left the unit with dr. Dwyane Dee at this time.

## 2022-12-19 NOTE — ED Provider Notes (Signed)
FBC/OBS ASAP Discharge Summary  Date and Time: 12/19/2022 9:35 AM  Name: Richard Benitez  MRN:  [id number]   Discharge Diagnoses:  Final diagnoses:  Auditory hallucination  Schizophrenia, unspecified type    Subjective: Richard Benitez was seen this morning. He reports he slept fine and has been eating well. He denies current pain, physical discomfort. He reports his mood is good. He denies visual hallucinations. He reports auditory hallucinations of a voice saying it is going to hurt him. This has been ongoing for around 2 years. He reports it is usually one voice, unsure if it is his own or a separate voice. The voice usually occurs in the morning and improves as the day goes on; he does not report hearing the voice every day. He is unsure what medications have helped him with this in the past. He does not want to follow the voice or hurt himself, and reports being "confused" when he hears the voice. No SI/HI. No delusions.   Of note, when pt was then interviewed around 20 minutes later with Dr. Dwyane Dee, he denied current AH. He did report that he had previously been experiencing AH for the past year, different from his prior timeline.   He is currently unemployed. No longer drives due to not having a car. Social support is only mother and father, who he lives with; denies leaving house much, states he just sits in a chair in his room during the day, no TV use.  Spoke with patient's mother Barbara Calcano regarding management and plan. Discussed how patient is currently not suicidal, homicidal and is intermittently reporting auditory hallucinations.  Discussed how patient should be more stimulated throughout his stay today.  Emphasized the importance of medication compliance.  Spoke about prescribing him a new antipsychotic due to Haldol having adverse effects on him previously.  Discussed how she should closely follow-up with the outpatient psychiatrist regarding medication management.  Stay Summary:  Patient came yesterday evening and stayed for observation.  Total Time spent with patient: 45 minutes  Past Psychiatric History: Schizoaffective disorder, anxiety, depression, substance abuse  Past Medical History: Chronic back pain Family History: Unknown Family Psychiatric  History: Unknown Social History:  Living: With parents School: Unknown Support: Parents Smoking: Tobacco Alcohol: Denies current use Illicit drugs: Unknown   Additional Social History:  Pain Medications: See MAR Prescriptions: See MAR Over the Counter: See MAR History of alcohol / drug use?: No history of alcohol / drug abuse Longest period of sobriety (when/how long): N/A Negative Consequences of Use:  (N/A) Withdrawal Symptoms:  (N/A)  Current Medications:  Current Facility-Administered Medications  Medication Dose Route Frequency Provider Last Rate Last Admin   acetaminophen (TYLENOL) tablet 650 mg  650 mg Oral Q6H PRN Evette Georges, NP       alum & mag hydroxide-simeth (MAALOX/MYLANTA) 200-200-20 MG/5ML suspension 30 mL  30 mL Oral Q4H PRN Evette Georges, NP       OLANZapine zydis (ZYPREXA) disintegrating tablet 10 mg  10 mg Oral Q8H PRN Evette Georges, NP       And   LORazepam (ATIVAN) tablet 1 mg  1 mg Oral PRN Evette Georges, NP       And   ziprasidone (GEODON) injection 20 mg  20 mg Intramuscular PRN Evette Georges, NP       magnesium hydroxide (MILK OF MAGNESIA) suspension 30 mL  30 mL Oral Daily PRN Evette Georges, NP       Current Outpatient Medications  Medication Sig  Dispense Refill   ALPRAZolam (XANAX) 1 MG tablet Take 1 mg by mouth 3 (three) times daily as needed.     chlorhexidine (PERIDEX) 0.12 % solution 2 (two) times daily.     clonazePAM (KLONOPIN) 1 MG tablet Take 1 mg by mouth 3 (three) times daily.     cyclobenzaprine (FLEXERIL) 10 MG tablet Take 2 tablets (20 mg total) by mouth 3 (three) times daily as needed for muscle spasms. (Patient taking differently: Take 10 mg by mouth 3  (three) times daily as needed for muscle spasms.) 30 tablet 0   HYDROcodone-acetaminophen (NORCO) 10-325 MG per tablet Take 1-2 tablets by mouth 3 (three) times daily as needed for moderate pain or severe pain.      metoprolol succinate (TOPROL-XL) 25 MG 24 hr tablet Take 25 mg by mouth daily.     morphine (MS CONTIN) 60 MG 12 hr tablet Take 60 mg by mouth every 12 (twelve) hours.     rosuvastatin (CRESTOR) 5 MG tablet Take 5 mg by mouth daily.     tamsulosin (FLOMAX) 0.4 MG CAPS capsule Take 1 capsule (0.4 mg total) by mouth daily. 30 capsule 0   traZODone (DESYREL) 100 MG tablet Take 200 mg by mouth at bedtime. (Patient not taking: Reported on 12/17/2022)      PTA Medications:  Facility Ordered Medications  Medication   [COMPLETED] hydrOXYzine (ATARAX) tablet 50 mg   [COMPLETED] OLANZapine (ZYPREXA) tablet 10 mg   [COMPLETED] clonazePAM (KLONOPIN) tablet 1 mg   acetaminophen (TYLENOL) tablet 650 mg   alum & mag hydroxide-simeth (MAALOX/MYLANTA) 200-200-20 MG/5ML suspension 30 mL   magnesium hydroxide (MILK OF MAGNESIA) suspension 30 mL   OLANZapine zydis (ZYPREXA) disintegrating tablet 10 mg   And   LORazepam (ATIVAN) tablet 1 mg   And   ziprasidone (GEODON) injection 20 mg   PTA Medications  Medication Sig   HYDROcodone-acetaminophen (NORCO) 10-325 MG per tablet Take 1-2 tablets by mouth 3 (three) times daily as needed for moderate pain or severe pain.    morphine (MS CONTIN) 60 MG 12 hr tablet Take 60 mg by mouth every 12 (twelve) hours.   clonazePAM (KLONOPIN) 1 MG tablet Take 1 mg by mouth 3 (three) times daily.   cyclobenzaprine (FLEXERIL) 10 MG tablet Take 2 tablets (20 mg total) by mouth 3 (three) times daily as needed for muscle spasms. (Patient taking differently: Take 10 mg by mouth 3 (three) times daily as needed for muscle spasms.)   tamsulosin (FLOMAX) 0.4 MG CAPS capsule Take 1 capsule (0.4 mg total) by mouth daily.   ALPRAZolam (XANAX) 1 MG tablet Take 1 mg by mouth 3  (three) times daily as needed.   chlorhexidine (PERIDEX) 0.12 % solution 2 (two) times daily.   metoprolol succinate (TOPROL-XL) 25 MG 24 hr tablet Take 25 mg by mouth daily.   rosuvastatin (CRESTOR) 5 MG tablet Take 5 mg by mouth daily.   traZODone (DESYREL) 100 MG tablet Take 200 mg by mouth at bedtime. (Patient not taking: Reported on 12/17/2022)        No data to display          [place] ED from 12/18/2022 in [facility] ED from 12/17/2022 in [facility] Emergency Department at [facility] ED from 04/29/2021 in [facility] Emergency Department at [place] No Risk Low Risk No Risk       Musculoskeletal  Strength & Muscle Tone: within normal limits  Gait & Station: normal Patient leans: N/A  Psychiatric Specialty Exam  General Appearance: appears at stated age, dressed in scrubs and groomed    Behavior: pleasant and cooperative   Psychomotor Activity: pt is fidgeting in seat and bouncing leg throughout interview   Eye Contact: intense Speech: normal amount, tone, volume.      Mood: "good" Affect: restrictive   Thought Process: linear, goal directed, no circumstantial or tangential thought process noted, no racing thoughts or flight of ideas  Some delay between questions and answers Descriptions of Associations: intact   Thought Content Hallucinations: Reports recent AH: voice stating its going to hurt him; however, in subsequent interview, denied AH; denies VH , does not appear responding to stimuli.  Delusions: no paranoia, delusions of control, grandeur, ideas of reference, thought broadcasting, and magical thinking  Suicidal Thoughts: denies SI, intention, plan  Homicidal Thoughts: denies HI, intention, plan    Alertness/Orientation: alert and oriented to person only, not oriented to time and situation and place   Insight: poor Judgment: poor   Memory: poor given pt could not remember recent  Forest[facility] Na visit or medications and uncertain timeline regarding AH symptoms   Executive Functions  Concentration: intact Attention Span: fair Recall: impaired due to inability to remember recent Forestine Na visit yesterday. Fund of Knowledge: reduced     Psychomotor Activity  Psychomotor Activity: Psychomotor Activity: Normal     Assets  Assets: Desire for Improvement; Resilience     Sleep  Sleep: Sleep: Fair     Nutritional Assessment (For OBS and FBC admissions only) Has the patient had a weight loss or gain of 10 pounds or more in the last 3 months?: No Has the patient had a decrease in food intake/or appetite?: No Does the patient have dental problems?: No Does the patient have eating habits or behaviors that may be indicators of an eating disorder including binging or inducing vomiting?: No Has the patient recently lost weight without trying?: 0 Has the patient been eating poorly because of a decreased appetite?: 0 Malnutrition Screening Tool Score: 0   Physical Exam  Physical Exam  Constitutional:      Appearance: Normal appearance.  Pulmonary:     Effort: Pulmonary effort is normal.  Neurological:     General: No focal deficit present.     Mental Status: Alert and oriented to person     Review of Systems  Constitutional: Negative.  Negative for chills, fever and weight loss.  HENT: Negative.    Eyes: Negative.   Respiratory: Negative.    Cardiovascular: Negative.   Gastrointestinal:  Negative for constipation, diarrhea, nausea and vomiting.  Genitourinary: Negative.   Musculoskeletal: Negative.   Skin: Negative.   Neurological: Negative.  Negative for tingling.    Blood pressure (!) 145/87, pulse 94, temperature 98.2 F (36.8 C), temperature source Oral, resp. rate 17, SpO2 98 %. There is no height or weight on file to calculate BMI.  Demographic Factors:  Male, Caucasian, Unemployed, and Access to firearms  Loss Factors: Decline in physical  health  Historical Factors: NA  Risk Reduction Factors:   Living with another person, especially a relative and Positive social support  Continued Clinical Symptoms:  Schizophrenia:   Command hallucinatons Depressive state Chronic Pain  Cognitive Features That Contribute To Risk:  Loss of executive function    Suicide Risk:  Mild:  Suicidal ideation of limited frequency, intensity, duration, and specificity.  There are no identifiable plans, no associated intent, mild dysphoria  and related symptoms, good self-control (both objective and subjective assessment), few other risk factors, and identifiable protective factors, including available and accessible social support.  Plan Of Care/Follow-up recommendations:  Activity:  as tolerated Diet:  normal Tests:  none  Disposition: home   Alesia Morin, MD 12/19/2022, 9:35 AM

## 2022-12-19 NOTE — Discharge Instructions (Addendum)
Follow-up recommendations:  Activity:  Normal, as tolerated Diet:  Per PCP recommendation  Patient is instructed prior to discharge to: Take all medications as prescribed by her mental healthcare provider. Report any adverse effects and/or reactions from the medicines to her outpatient provider promptly. Patient has been instructed & cautioned: To not engage in alcohol and or illegal drug use while on prescription medicines.  In the event of worsening symptoms, patient is instructed to call the crisis hotline at 988, 911 and or go to the nearest ED for appropriate evaluation and treatment of symptoms. To follow-up with her primary care provider for your other medical issues, concerns and or health care needs.    Based on the information that you have provided and the presenting issues outpatient services and resources for have been recommended.  It is imperative that you follow through with treatment recommendations within 5-7 days from the of discharge to mitigate further risk to your safety and mental well-being. A list of referrals has been provided below to get you started.  You are not limited to the list provided.  In case of an urgent crisis, you may contact the Mobile Crisis Unit with [facility] at [phone].  Patient has a follow up appt with his established provider Dr. Toy Care on  Wednesday  01-01-2023 at 3:15pm

## 2022-12-19 NOTE — ED Notes (Signed)
Beatriz Stallion NP spoke with pt's family about medication switch and new prescription for zyprexa.  Pt was escorted to lobby. Pt only had scrubs with him and no other belongings. Pt's father verbalized understanding of medication prescription and follow up with Dr. Toy Care.   No further distress noted.

## 2022-12-19 NOTE — ED Notes (Signed)
Pt returned from speaking with provider.

## 2022-12-20 DIAGNOSIS — R440 Auditory hallucinations: Secondary | ICD-10-CM | POA: Diagnosis not present

## 2022-12-20 DIAGNOSIS — Z20822 Contact with and (suspected) exposure to covid-19: Secondary | ICD-10-CM | POA: Diagnosis not present

## 2022-12-20 DIAGNOSIS — F17210 Nicotine dependence, cigarettes, uncomplicated: Secondary | ICD-10-CM | POA: Diagnosis not present

## 2022-12-23 ENCOUNTER — Telehealth: Payer: Self-pay

## 2022-12-23 NOTE — Telephone Encounter (Signed)
     Patient  visit on 4/3  at [facility]  Have you been able to follow up with your primary care physician? Yes    The patient was or was not able to obtain any needed medicine or equipment. Yes   Are there diet recommendations that you are having difficulty following? Na   Patient expresses understanding of discharge instructions and education provided has no other needs at this time.  Yes      Lenard Forth [facility] Guide, [facility] [phone] [address] [place], [place], [place] [address] Phone: [phone] Email: [email or web address]

## 2022-12-24 DIAGNOSIS — E785 Hyperlipidemia, unspecified: Secondary | ICD-10-CM | POA: Diagnosis not present

## 2022-12-24 DIAGNOSIS — F22 Delusional disorders: Secondary | ICD-10-CM | POA: Diagnosis not present

## 2022-12-24 DIAGNOSIS — F1910 Other psychoactive substance abuse, uncomplicated: Secondary | ICD-10-CM | POA: Diagnosis not present

## 2022-12-24 DIAGNOSIS — M5489 Other dorsalgia: Secondary | ICD-10-CM | POA: Diagnosis not present

## 2022-12-24 DIAGNOSIS — E669 Obesity, unspecified: Secondary | ICD-10-CM | POA: Diagnosis not present

## 2022-12-24 DIAGNOSIS — Z720 Tobacco use: Secondary | ICD-10-CM | POA: Diagnosis not present

## 2022-12-24 DIAGNOSIS — F259 Schizoaffective disorder, unspecified: Secondary | ICD-10-CM | POA: Diagnosis not present

## 2022-12-24 DIAGNOSIS — I10 Essential (primary) hypertension: Secondary | ICD-10-CM | POA: Diagnosis not present

## 2023-02-24 DIAGNOSIS — M5489 Other dorsalgia: Secondary | ICD-10-CM | POA: Diagnosis not present

## 2023-02-24 DIAGNOSIS — Z720 Tobacco use: Secondary | ICD-10-CM | POA: Diagnosis not present

## 2023-02-24 DIAGNOSIS — I10 Essential (primary) hypertension: Secondary | ICD-10-CM | POA: Diagnosis not present

## 2023-05-20 DIAGNOSIS — F22 Delusional disorders: Secondary | ICD-10-CM | POA: Diagnosis not present

## 2023-05-20 DIAGNOSIS — E669 Obesity, unspecified: Secondary | ICD-10-CM | POA: Diagnosis not present

## 2023-05-20 DIAGNOSIS — I10 Essential (primary) hypertension: Secondary | ICD-10-CM | POA: Diagnosis not present

## 2023-05-20 DIAGNOSIS — R7303 Prediabetes: Secondary | ICD-10-CM | POA: Diagnosis not present

## 2023-05-20 DIAGNOSIS — F1910 Other psychoactive substance abuse, uncomplicated: Secondary | ICD-10-CM | POA: Diagnosis not present

## 2023-05-20 DIAGNOSIS — Z720 Tobacco use: Secondary | ICD-10-CM | POA: Diagnosis not present

## 2023-05-20 DIAGNOSIS — F259 Schizoaffective disorder, unspecified: Secondary | ICD-10-CM | POA: Diagnosis not present

## 2023-05-20 DIAGNOSIS — E785 Hyperlipidemia, unspecified: Secondary | ICD-10-CM | POA: Diagnosis not present

## 2023-08-18 DIAGNOSIS — I10 Essential (primary) hypertension: Secondary | ICD-10-CM | POA: Diagnosis not present

## 2023-08-18 DIAGNOSIS — M5489 Other dorsalgia: Secondary | ICD-10-CM | POA: Diagnosis not present

## 2023-08-18 DIAGNOSIS — Z720 Tobacco use: Secondary | ICD-10-CM | POA: Diagnosis not present

## 2023-08-18 DIAGNOSIS — F259 Schizoaffective disorder, unspecified: Secondary | ICD-10-CM | POA: Diagnosis not present

## 2023-11-17 DIAGNOSIS — F259 Schizoaffective disorder, unspecified: Secondary | ICD-10-CM | POA: Diagnosis not present

## 2023-11-17 DIAGNOSIS — G894 Chronic pain syndrome: Secondary | ICD-10-CM | POA: Diagnosis not present

## 2023-11-17 DIAGNOSIS — I10 Essential (primary) hypertension: Secondary | ICD-10-CM | POA: Diagnosis not present

## 2023-11-17 DIAGNOSIS — M5489 Other dorsalgia: Secondary | ICD-10-CM | POA: Diagnosis not present

## 2023-11-17 DIAGNOSIS — Z720 Tobacco use: Secondary | ICD-10-CM | POA: Diagnosis not present

## 2023-11-25 DIAGNOSIS — I10 Essential (primary) hypertension: Secondary | ICD-10-CM | POA: Diagnosis not present

## 2023-11-25 DIAGNOSIS — R7303 Prediabetes: Secondary | ICD-10-CM | POA: Diagnosis not present

## 2023-11-25 DIAGNOSIS — Z125 Encounter for screening for malignant neoplasm of prostate: Secondary | ICD-10-CM | POA: Diagnosis not present

## 2023-11-25 DIAGNOSIS — E785 Hyperlipidemia, unspecified: Secondary | ICD-10-CM | POA: Diagnosis not present

## 2023-12-02 DIAGNOSIS — E785 Hyperlipidemia, unspecified: Secondary | ICD-10-CM | POA: Diagnosis not present

## 2023-12-02 DIAGNOSIS — Z1331 Encounter for screening for depression: Secondary | ICD-10-CM | POA: Diagnosis not present

## 2023-12-02 DIAGNOSIS — F259 Schizoaffective disorder, unspecified: Secondary | ICD-10-CM | POA: Diagnosis not present

## 2023-12-02 DIAGNOSIS — I10 Essential (primary) hypertension: Secondary | ICD-10-CM | POA: Diagnosis not present

## 2023-12-02 DIAGNOSIS — R7303 Prediabetes: Secondary | ICD-10-CM | POA: Diagnosis not present

## 2023-12-02 DIAGNOSIS — F1910 Other psychoactive substance abuse, uncomplicated: Secondary | ICD-10-CM | POA: Diagnosis not present

## 2023-12-02 DIAGNOSIS — M5489 Other dorsalgia: Secondary | ICD-10-CM | POA: Diagnosis not present

## 2023-12-02 DIAGNOSIS — Z0000 Encounter for general adult medical examination without abnormal findings: Secondary | ICD-10-CM | POA: Diagnosis not present

## 2023-12-02 DIAGNOSIS — G894 Chronic pain syndrome: Secondary | ICD-10-CM | POA: Diagnosis not present

## 2023-12-02 DIAGNOSIS — R82998 Other abnormal findings in urine: Secondary | ICD-10-CM | POA: Diagnosis not present

## 2023-12-02 DIAGNOSIS — E669 Obesity, unspecified: Secondary | ICD-10-CM | POA: Diagnosis not present

## 2023-12-02 DIAGNOSIS — Z1339 Encounter for screening examination for other mental health and behavioral disorders: Secondary | ICD-10-CM | POA: Diagnosis not present

## 2024-02-25 DIAGNOSIS — F259 Schizoaffective disorder, unspecified: Secondary | ICD-10-CM | POA: Diagnosis not present

## 2024-02-25 DIAGNOSIS — E669 Obesity, unspecified: Secondary | ICD-10-CM | POA: Diagnosis not present

## 2024-02-25 DIAGNOSIS — G894 Chronic pain syndrome: Secondary | ICD-10-CM | POA: Diagnosis not present

## 2024-02-25 DIAGNOSIS — Z720 Tobacco use: Secondary | ICD-10-CM | POA: Diagnosis not present

## 2024-02-25 DIAGNOSIS — F1910 Other psychoactive substance abuse, uncomplicated: Secondary | ICD-10-CM | POA: Diagnosis not present

## 2024-02-25 DIAGNOSIS — R7303 Prediabetes: Secondary | ICD-10-CM | POA: Diagnosis not present

## 2024-02-25 DIAGNOSIS — I10 Essential (primary) hypertension: Secondary | ICD-10-CM | POA: Diagnosis not present

## 2024-02-25 DIAGNOSIS — M5489 Other dorsalgia: Secondary | ICD-10-CM | POA: Diagnosis not present
